# Patient Record
Sex: Female | Born: 1960 | Race: Black or African American | Hispanic: No | Smoking: Never smoker
Health system: Southern US, Community
[De-identification: ages and names within clinical notes are randomized; demographics above are authoritative.]

## PROBLEM LIST (undated history)

## (undated) DIAGNOSIS — Z8709 Personal history of other diseases of the respiratory system: Secondary | ICD-10-CM

## (undated) DIAGNOSIS — M545 Low back pain, unspecified: Secondary | ICD-10-CM

## (undated) DIAGNOSIS — T753XXA Motion sickness, initial encounter: Secondary | ICD-10-CM

## (undated) DIAGNOSIS — M1990 Unspecified osteoarthritis, unspecified site: Secondary | ICD-10-CM

## (undated) DIAGNOSIS — K219 Gastro-esophageal reflux disease without esophagitis: Secondary | ICD-10-CM

## (undated) DIAGNOSIS — R0789 Other chest pain: Secondary | ICD-10-CM

## (undated) DIAGNOSIS — E119 Type 2 diabetes mellitus without complications: Secondary | ICD-10-CM

## (undated) HISTORY — PX: TUBAL LIGATION: SHX77

---

## 1997-12-26 ENCOUNTER — Emergency Department (HOSPITAL_COMMUNITY): Admission: EM | Admit: 1997-12-26 | Discharge: 1997-12-26 | Payer: Self-pay

## 2004-03-23 ENCOUNTER — Ambulatory Visit: Payer: Self-pay | Admitting: Cardiovascular Disease

## 2004-03-23 HISTORY — PX: CARDIAC CATHETERIZATION: SHX172

## 2007-01-24 ENCOUNTER — Ambulatory Visit: Payer: Self-pay

## 2010-09-26 ENCOUNTER — Other Ambulatory Visit: Payer: Self-pay | Admitting: Obstetrics & Gynecology

## 2010-09-26 DIAGNOSIS — R1900 Intra-abdominal and pelvic swelling, mass and lump, unspecified site: Secondary | ICD-10-CM

## 2010-10-03 ENCOUNTER — Ambulatory Visit (HOSPITAL_COMMUNITY)
Admission: RE | Admit: 2010-10-03 | Discharge: 2010-10-03 | Disposition: A | Discharge status: Home / Self Care | Payer: Private Health Insurance - Indemnity | Source: Ambulatory Visit | Attending: Obstetrics & Gynecology | Admitting: Obstetrics & Gynecology

## 2010-10-03 DIAGNOSIS — D259 Leiomyoma of uterus, unspecified: Secondary | ICD-10-CM | POA: Insufficient documentation

## 2010-10-03 DIAGNOSIS — N949 Unspecified condition associated with female genital organs and menstrual cycle: Secondary | ICD-10-CM | POA: Insufficient documentation

## 2010-10-03 DIAGNOSIS — R1900 Intra-abdominal and pelvic swelling, mass and lump, unspecified site: Secondary | ICD-10-CM

## 2010-10-03 MED ORDER — GADOBENATE DIMEGLUMINE 529 MG/ML IV SOLN
20.0000 mL | Freq: Once | INTRAVENOUS | Status: AC | PRN
  Administered 2010-10-03: 20 mL via INTRAVENOUS

## 2010-10-17 ENCOUNTER — Other Ambulatory Visit: Payer: Self-pay | Admitting: Obstetrics & Gynecology

## 2010-11-20 ENCOUNTER — Encounter (HOSPITAL_COMMUNITY)
Admission: RE | Admit: 2010-11-20 | Discharge: 2010-11-20 | Disposition: A | Discharge status: Home / Self Care | Payer: Managed Care, Other (non HMO) | Source: Ambulatory Visit | Attending: Obstetrics & Gynecology | Admitting: Obstetrics & Gynecology

## 2010-11-20 ENCOUNTER — Encounter (HOSPITAL_COMMUNITY): Payer: Self-pay

## 2010-11-20 HISTORY — DX: Gastro-esophageal reflux disease without esophagitis: K21.9

## 2010-11-20 HISTORY — DX: Other chest pain: R07.89

## 2010-11-20 LAB — CBC
HCT: 38.8 % (ref 36.0–46.0)
MCHC: 33.8 g/dL (ref 30.0–36.0)
MCV: 93.3 fL (ref 78.0–100.0)
RDW: 13.2 % (ref 11.5–15.5)

## 2010-11-20 NOTE — Patient Instructions (Signed)
   Your procedure is scheduled on:11/27/10, Monday  Enter through the Main Entrance of [facility] at:0600 Pick up the phone at the desk and dial [phone] and inform us of your arrival.  Please call this number if you have any problems the morning of surgery: [phone]  Remember: Do not eat food after midnight: Sunday Do not drink clear liquids after:0330 am Take these medicines the morning of surgery with a SIP OF WATER:none  Do not wear jewelry, make-up, or FINGER nail polish Do not wear lotions, powders, or perfumes.  You may wear deodorant. Do not shave 48 hours prior to surgery. Do not bring valuables to the hospital.  Leave suitcase in the car. After Surgery it may be brought to your room. For patients being admitted to the hospital, checkout time is 11:00am the day of discharge.  Patients discharged on the day of surgery will not be allowed to drive home.   Name and phone number of your driver: Jake Shark- [phone]  Remember to use your hibiclens as instructed.Please shower with 1/2 bottle the evening before your surgery and the other 1/2 bottle the morning of surgery.

## 2010-11-23 ENCOUNTER — Encounter (HOSPITAL_COMMUNITY): Payer: Self-pay | Admitting: Obstetrics & Gynecology

## 2010-11-23 DIAGNOSIS — D259 Leiomyoma of uterus, unspecified: Secondary | ICD-10-CM | POA: Diagnosis present

## 2010-11-23 NOTE — H&P (Signed)
Hannah [name] is an 50 y.o. female with symptomatic uterine fibroids who presents for [name] hysterectomy.  HPI:  The patient has had pelvic pain and heavy menses.  Workup has included [name] pelvic MRI which showed [name] normal uterus with [name] 10 cm, pedunculated myoma.  The myoma had degenerative changes.  Pert.inent Gynecological History: Menses: flow is moderate                     Contraception: tubal ligation  Previous GYN Procedures: BTL       Past Medical History  Diagnosis Date  . Chest pain, atypical     neg workup- has physical job, lots of lifting, sore muscles  . GERD (gastroesophageal reflux disease)     uses Tums as needed    Past Surgical History  Procedure Date  . Tubal ligation     History reviewed. No pertinent family history.  Social History:  reports that she has never smoked. She does not have any smokeless tobacco history on file. She reports that she does not drink alcohol or use illicit drugs.  Allergies: No Known Allergies  No prescriptions prior to admission    Review of Systems  Constitutional: Negative for fever.  Eyes: Negative for blurred vision.  Respiratory: Negative for shortness of breath.   Gastrointestinal: Negative for nausea and vomiting.  Skin: Negative for rash.  Neurological: Negative for headaches.    There were no vitals taken for this visit. Physical Exam  Constitutional: She appears well-developed.  HENT:  Head: Normocephalic.  Neck: Neck supple. No thyromegaly present.  Cardiovascular: Normal rate and regular rhythm.   Respiratory: Breath sounds normal.  GI: Soft. Bowel sounds are normal.  Genitourinary: Uterus is enlarged.       Mass fills posterior pelvis.  Mobile.  Skin: No rash noted.    No results found for this or any previous visit (from the past 24 hour(s)).  No results found.  Assessment/Plan: 50 y.o. with symptomatic uterine fibroids who presents for an attempted total robotic-assisted hysterectomy. The  risks, benefits and alternative forms of management were reviewed including but not limited to infection, hemorrhage, inadvertent injury to adjacent organs and thromboembolic complications.  Admit Total robotic-assisted hysterectomy--the ovaries are to be conserved  JACKSON-MOORE,Hannah [name] 11/23/2010, 9:51 PM

## 2010-11-26 MED ORDER — CEFAZOLIN SODIUM-DEXTROSE 2-3 GM-% IV SOLR
2.0000 g | INTRAVENOUS | Status: AC
  Administered 2010-11-27: 2 g via INTRAVENOUS
  Filled 2010-11-26: qty 50

## 2010-11-27 ENCOUNTER — Encounter (HOSPITAL_COMMUNITY): Payer: Self-pay | Admitting: Anesthesiology

## 2010-11-27 ENCOUNTER — Other Ambulatory Visit: Payer: Self-pay | Admitting: Obstetrics & Gynecology

## 2010-11-27 ENCOUNTER — Ambulatory Visit (HOSPITAL_COMMUNITY): Payer: Managed Care, Other (non HMO) | Admitting: Anesthesiology

## 2010-11-27 ENCOUNTER — Inpatient Hospital Stay (HOSPITAL_COMMUNITY)
Admission: RE | Admit: 2010-11-27 | Discharge: 2010-11-29 | DRG: 743 | Disposition: A | Discharge status: Home / Self Care | Payer: Managed Care, Other (non HMO) | Source: Ambulatory Visit | Attending: Obstetrics & Gynecology | Admitting: Obstetrics & Gynecology

## 2010-11-27 ENCOUNTER — Encounter (HOSPITAL_COMMUNITY): Payer: Self-pay | Admitting: *Deleted

## 2010-11-27 ENCOUNTER — Encounter (HOSPITAL_COMMUNITY)
Admission: RE | Disposition: A | Discharge status: Home / Self Care | Payer: Self-pay | Source: Ambulatory Visit | Attending: Obstetrics & Gynecology

## 2010-11-27 DIAGNOSIS — D259 Leiomyoma of uterus, unspecified: Secondary | ICD-10-CM

## 2010-11-27 DIAGNOSIS — D251 Intramural leiomyoma of uterus: Secondary | ICD-10-CM | POA: Diagnosis present

## 2010-11-27 DIAGNOSIS — D250 Submucous leiomyoma of uterus: Secondary | ICD-10-CM | POA: Diagnosis present

## 2010-11-27 DIAGNOSIS — N7013 Chronic salpingitis and oophoritis: Secondary | ICD-10-CM | POA: Diagnosis present

## 2010-11-27 DIAGNOSIS — N920 Excessive and frequent menstruation with regular cycle: Principal | ICD-10-CM | POA: Diagnosis present

## 2010-11-27 DIAGNOSIS — N949 Unspecified condition associated with female genital organs and menstrual cycle: Secondary | ICD-10-CM | POA: Diagnosis present

## 2010-11-27 DIAGNOSIS — Z5331 Laparoscopic surgical procedure converted to open procedure: Secondary | ICD-10-CM

## 2010-11-27 HISTORY — PX: LAPAROSCOPY: SHX197

## 2010-11-27 HISTORY — PX: ABDOMINAL HYSTERECTOMY: SHX81

## 2010-11-27 LAB — CBC
Hemoglobin: 12.1 g/dL (ref 12.0–15.0)
MCH: 31.5 pg (ref 26.0–34.0)
RBC: 3.84 MIL/uL — ABNORMAL LOW (ref 3.87–5.11)

## 2010-11-27 LAB — TYPE AND SCREEN: Antibody Screen: NEGATIVE

## 2010-11-27 LAB — ABO/RH: ABO/RH(D): O POS

## 2010-11-27 SURGERY — LAPAROSCOPY, DIAGNOSTIC
Anesthesia: General | Site: Abdomen | Laterality: Right | Wound class: Clean

## 2010-11-27 MED ORDER — HYDROMORPHONE HCL 1 MG/ML IJ SOLN
INTRAMUSCULAR | Status: DC | PRN
  Administered 2010-11-27: 1 mg via INTRAVENOUS

## 2010-11-27 MED ORDER — KETOROLAC TROMETHAMINE 30 MG/ML IJ SOLN
30.0000 mg | Freq: Four times a day (QID) | INTRAMUSCULAR | Status: DC
  Administered 2010-11-27 – 2010-11-29 (×4): 30 mg via INTRAVENOUS
  Filled 2010-11-27 (×4): qty 1

## 2010-11-27 MED ORDER — MIDAZOLAM HCL 2 MG/2ML IJ SOLN
INTRAMUSCULAR | Status: AC
  Filled 2010-11-27: qty 2

## 2010-11-27 MED ORDER — LIDOCAINE HCL (CARDIAC) 20 MG/ML IV SOLN
INTRAVENOUS | Status: AC
  Filled 2010-11-27: qty 5

## 2010-11-27 MED ORDER — MAGNESIUM HYDROXIDE 400 MG/5ML PO SUSP: 30.0000 mL | Freq: Every day | ORAL | Status: DC | PRN

## 2010-11-27 MED ORDER — GLYCOPYRROLATE 0.2 MG/ML IJ SOLN
INTRAMUSCULAR | Status: AC
  Filled 2010-11-27: qty 2

## 2010-11-27 MED ORDER — FENTANYL CITRATE 0.05 MG/ML IJ SOLN
INTRAMUSCULAR | Status: AC
  Filled 2010-11-27: qty 5

## 2010-11-27 MED ORDER — PHENYLEPHRINE 40 MCG/ML (10ML) SYRINGE FOR IV PUSH (FOR BLOOD PRESSURE SUPPORT)
PREFILLED_SYRINGE | INTRAVENOUS | Status: AC
  Filled 2010-11-27: qty 10

## 2010-11-27 MED ORDER — KETOROLAC TROMETHAMINE 30 MG/ML IJ SOLN
INTRAMUSCULAR | Status: DC | PRN
  Administered 2010-11-27: 30 mg via INTRAVENOUS

## 2010-11-27 MED ORDER — ONDANSETRON HCL 4 MG/2ML IJ SOLN
INTRAMUSCULAR | Status: DC | PRN
  Administered 2010-11-27 (×2): 2 mg via INTRAVENOUS

## 2010-11-27 MED ORDER — KCL IN DEXTROSE-NACL 20-5-0.45 MEQ/L-%-% IV SOLN
INTRAVENOUS | Status: DC
  Administered 2010-11-27 – 2010-11-28 (×3): via INTRAVENOUS
  Filled 2010-11-27 (×6): qty 1000

## 2010-11-27 MED ORDER — KETOROLAC TROMETHAMINE 30 MG/ML IJ SOLN
INTRAMUSCULAR | Status: AC
  Filled 2010-11-27: qty 1

## 2010-11-27 MED ORDER — KETOROLAC TROMETHAMINE 30 MG/ML IJ SOLN: 30.0000 mg | Freq: Four times a day (QID) | INTRAMUSCULAR | Status: DC

## 2010-11-27 MED ORDER — NEOSTIGMINE METHYLSULFATE 1 MG/ML IJ SOLN
INTRAMUSCULAR | Status: DC | PRN
  Administered 2010-11-27: 5 mg via INTRAVENOUS

## 2010-11-27 MED ORDER — HYDROMORPHONE 0.3 MG/ML IV SOLN
INTRAVENOUS | Status: DC
  Administered 2010-11-27: 1.2 mg via INTRAVENOUS
  Administered 2010-11-27: 12:00:00 via INTRAVENOUS
  Administered 2010-11-27: 2.1 mg via INTRAVENOUS
  Administered 2010-11-27 – 2010-11-28 (×2): 0.6 mg via INTRAVENOUS
  Administered 2010-11-28: 0.9 mg via INTRAVENOUS

## 2010-11-27 MED ORDER — LIDOCAINE HCL (CARDIAC) 20 MG/ML IV SOLN
INTRAVENOUS | Status: DC | PRN
  Administered 2010-11-27: 60 mg via INTRAVENOUS

## 2010-11-27 MED ORDER — ONDANSETRON HCL 4 MG/2ML IJ SOLN
INTRAMUSCULAR | Status: AC
  Filled 2010-11-27: qty 2

## 2010-11-27 MED ORDER — BUPIVACAINE LIPOSOME 1.3 % IJ SUSP
20.0000 mL | Freq: Once | INTRAMUSCULAR | Status: DC
  Filled 2010-11-27: qty 20

## 2010-11-27 MED ORDER — ONDANSETRON HCL 4 MG/2ML IJ SOLN: 4.0000 mg | Freq: Four times a day (QID) | INTRAMUSCULAR | Status: DC | PRN

## 2010-11-27 MED ORDER — DIPHENHYDRAMINE HCL 50 MG/ML IJ SOLN: 12.5000 mg | Freq: Four times a day (QID) | INTRAMUSCULAR | Status: DC | PRN

## 2010-11-27 MED ORDER — BUPIVACAINE HCL 0.25 % IJ SOLN
INTRAMUSCULAR | Status: DC | PRN
  Administered 2010-11-27: 3 mL

## 2010-11-27 MED ORDER — PHENYLEPHRINE HCL 10 MG/ML IJ SOLN
INTRAMUSCULAR | Status: DC | PRN
  Administered 2010-11-27 (×3): .08 mg via INTRAVENOUS

## 2010-11-27 MED ORDER — PROPOFOL 10 MG/ML IV EMUL
INTRAVENOUS | Status: AC
  Filled 2010-11-27: qty 20

## 2010-11-27 MED ORDER — NEOSTIGMINE METHYLSULFATE 1 MG/ML IJ SOLN
INTRAMUSCULAR | Status: AC
  Filled 2010-11-27: qty 10

## 2010-11-27 MED ORDER — DEXAMETHASONE SODIUM PHOSPHATE 4 MG/ML IJ SOLN
INTRAMUSCULAR | Status: DC | PRN
  Administered 2010-11-27 (×2): 5 mg via INTRAVENOUS

## 2010-11-27 MED ORDER — DIPHENHYDRAMINE HCL 12.5 MG/5ML PO ELIX: 12.5000 mg | ORAL_SOLUTION | Freq: Four times a day (QID) | ORAL | Status: DC | PRN

## 2010-11-27 MED ORDER — FENTANYL CITRATE 0.05 MG/ML IJ SOLN
INTRAMUSCULAR | Status: DC | PRN
  Administered 2010-11-27 (×2): 100 ug via INTRAVENOUS
  Administered 2010-11-27: 50 ug via INTRAVENOUS
  Administered 2010-11-27: 100 ug via INTRAVENOUS
  Administered 2010-11-27 (×2): 50 ug via INTRAVENOUS

## 2010-11-27 MED ORDER — IBUPROFEN 600 MG PO TABS
600.0000 mg | ORAL_TABLET | Freq: Four times a day (QID) | ORAL | Status: DC | PRN
  Administered 2010-11-29: 600 mg via ORAL
  Filled 2010-11-27: qty 1

## 2010-11-27 MED ORDER — BUPIVACAINE LIPOSOME 1.3 % IJ SUSP
INTRAMUSCULAR | Status: DC | PRN
  Administered 2010-11-27: 30 mL

## 2010-11-27 MED ORDER — MIDAZOLAM HCL 5 MG/5ML IJ SOLN
INTRAMUSCULAR | Status: DC | PRN
  Administered 2010-11-27: 1.5 mg via INTRAVENOUS
  Administered 2010-11-27: .5 mg via INTRAVENOUS

## 2010-11-27 MED ORDER — HYDROMORPHONE 0.3 MG/ML IV SOLN
INTRAVENOUS | Status: AC
  Filled 2010-11-27: qty 25

## 2010-11-27 MED ORDER — ONDANSETRON HCL 4 MG PO TABS: 4.0000 mg | ORAL_TABLET | Freq: Four times a day (QID) | ORAL | Status: DC | PRN

## 2010-11-27 MED ORDER — EPHEDRINE SULFATE 50 MG/ML IJ SOLN: INTRAMUSCULAR | Status: DC | PRN

## 2010-11-27 MED ORDER — DEXAMETHASONE SODIUM PHOSPHATE 10 MG/ML IJ SOLN
INTRAMUSCULAR | Status: AC
  Filled 2010-11-27: qty 1

## 2010-11-27 MED ORDER — HYDROMORPHONE HCL 1 MG/ML IJ SOLN
INTRAMUSCULAR | Status: AC
  Filled 2010-11-27: qty 1

## 2010-11-27 MED ORDER — PROPOFOL 10 MG/ML IV EMUL
INTRAVENOUS | Status: DC | PRN
  Administered 2010-11-27: 200 mg via INTRAVENOUS

## 2010-11-27 MED ORDER — MORPHINE SULFATE 10 MG/ML IJ SOLN: 0.0500 mg/kg | INTRAMUSCULAR | Status: DC | PRN

## 2010-11-27 MED ORDER — ROCURONIUM BROMIDE 50 MG/5ML IV SOLN
INTRAVENOUS | Status: AC
  Filled 2010-11-27: qty 2

## 2010-11-27 MED ORDER — OXYCODONE-ACETAMINOPHEN 5-325 MG PO TABS
1.0000 | ORAL_TABLET | ORAL | Status: DC | PRN
  Administered 2010-11-28 – 2010-11-29 (×5): 2 via ORAL
  Filled 2010-11-27 (×3): qty 2
  Filled 2010-11-27: qty 1
  Filled 2010-11-27 (×2): qty 2

## 2010-11-27 MED ORDER — GLYCOPYRROLATE 0.2 MG/ML IJ SOLN
INTRAMUSCULAR | Status: DC | PRN
  Administered 2010-11-27: .8 mg via INTRAVENOUS

## 2010-11-27 MED ORDER — ZOLPIDEM TARTRATE 5 MG PO TABS: 5.0000 mg | ORAL_TABLET | Freq: Every evening | ORAL | Status: DC | PRN

## 2010-11-27 MED ORDER — ROCURONIUM BROMIDE 100 MG/10ML IV SOLN
INTRAVENOUS | Status: DC | PRN
  Administered 2010-11-27: 50 mg via INTRAVENOUS

## 2010-11-27 MED ORDER — LACTATED RINGERS IV SOLN
INTRAVENOUS | Status: DC
  Administered 2010-11-27: 07:00:00 via INTRAVENOUS

## 2010-11-27 MED ORDER — SODIUM CHLORIDE 0.9 % IJ SOLN: 9.0000 mL | INTRAMUSCULAR | Status: DC | PRN

## 2010-11-27 MED ORDER — NALOXONE HCL 0.4 MG/ML IJ SOLN: 0.4000 mg | INTRAMUSCULAR | Status: DC | PRN

## 2010-11-27 MED ORDER — KETOROLAC TROMETHAMINE 30 MG/ML IJ SOLN: 30.0000 mg | Freq: Once | INTRAMUSCULAR | Status: DC

## 2010-11-27 MED ORDER — SODIUM CHLORIDE 0.9 % IR SOLN
Status: DC | PRN
  Administered 2010-11-27: 1000 mL

## 2010-11-27 SURGICAL SUPPLY — 96 items
BAG URINE DRAINAGE (UROLOGICAL SUPPLIES) ×4 IMPLANT
BARRIER ADHS 3X4 INTERCEED (GAUZE/BANDAGES/DRESSINGS) IMPLANT
BENZOIN TINCTURE PRP APPL 2/3 (GAUZE/BANDAGES/DRESSINGS) IMPLANT
BLADELESS LONG 8MM (BLADE) IMPLANT
CABLE HIGH FREQUENCY MONO STRZ (ELECTRODE) ×4 IMPLANT
CANISTER SUCTION 2500CC (MISCELLANEOUS) ×4 IMPLANT
CATH FOLEY 3WAY  5CC 16FR (CATHETERS) ×1
CATH FOLEY 3WAY 5CC 16FR (CATHETERS) ×3 IMPLANT
CELLS DAT CNTRL 66122 CELL SVR (MISCELLANEOUS) IMPLANT
CHLORAPREP W/TINT 26ML (MISCELLANEOUS) ×4 IMPLANT
CLOTH BEACON ORANGE TIMEOUT ST (SAFETY) ×4 IMPLANT
CONT PATH 16OZ SNAP LID 3702 (MISCELLANEOUS) ×4 IMPLANT
CORDS BIPOLAR (ELECTRODE) IMPLANT
COVER MAYO STAND STRL (DRAPES) ×4 IMPLANT
COVER TABLE BACK 60X90 (DRAPES) ×8 IMPLANT
COVER TIP SHEARS 8 DVNC (MISCELLANEOUS) ×3 IMPLANT
COVER TIP SHEARS 8MM DA VINCI (MISCELLANEOUS) ×1
DECANTER SPIKE VIAL GLASS SM (MISCELLANEOUS) ×4 IMPLANT
DERMABOND ADVANCED (GAUZE/BANDAGES/DRESSINGS) ×1
DERMABOND ADVANCED .7 DNX12 (GAUZE/BANDAGES/DRESSINGS) ×3 IMPLANT
DILATOR CANAL MILEX (MISCELLANEOUS) ×4 IMPLANT
DRAPE CAMERA CLOSED 9X96 (DRAPES) IMPLANT
DRAPE HUG U DISPOSABLE (DRAPE) ×4 IMPLANT
DRAPE LG THREE QUARTER DISP (DRAPES) ×8 IMPLANT
DRAPE MONITOR DA VINCI (DRAPE) ×4 IMPLANT
DRAPE UNDERBUTTOCKS STRL (DRAPE) ×4 IMPLANT
DRAPE UTILITY XL STRL (DRAPES) IMPLANT
DRAPE WARM FLUID 44X44 (DRAPE) ×4 IMPLANT
DRSG COVADERM 4X8 (GAUZE/BANDAGES/DRESSINGS) ×4 IMPLANT
ELECT BLADE 6 FLAT ULTRCLN (ELECTRODE) ×4 IMPLANT
ELECT REM PT RETURN 9FT ADLT (ELECTROSURGICAL) ×4
ELECTRODE REM PT RTRN 9FT ADLT (ELECTROSURGICAL) ×3 IMPLANT
EVACUATOR SMOKE 8.L (FILTER) ×4 IMPLANT
GAUZE SPONGE 4X4 16PLY XRAY LF (GAUZE/BANDAGES/DRESSINGS) ×4 IMPLANT
GAUZE VASELINE 3X9 (GAUZE/BANDAGES/DRESSINGS) IMPLANT
GLOVE BIO SURGEON STRL SZ 6.5 (GLOVE) ×20 IMPLANT
GLOVE BIO SURGEON STRL SZ8 (GLOVE) IMPLANT
GLOVE BIOGEL PI IND STRL 7.0 (GLOVE) ×12 IMPLANT
GLOVE BIOGEL PI INDICATOR 7.0 (GLOVE) ×4
GLOVE ECLIPSE 6.5 STRL STRAW (GLOVE) ×16 IMPLANT
GOWN PREVENTION PLUS LG XLONG (DISPOSABLE) ×40 IMPLANT
KIT DISP ACCESSORY 4 ARM (KITS) ×4 IMPLANT
MANIPULATOR UTERINE 4.5 ZUMI (MISCELLANEOUS) ×4 IMPLANT
NEEDLE HYPO 25X1 1.5 SAFETY (NEEDLE) IMPLANT
NEEDLE INSUFFLATION 14GA 120MM (NEEDLE) ×4 IMPLANT
NS IRRIG 1000ML POUR BTL (IV SOLUTION) ×4 IMPLANT
OCCLUDER COLPOPNEUMO (BALLOONS) ×4 IMPLANT
PACK ABDOMINAL GYN (CUSTOM PROCEDURE TRAY) IMPLANT
PACK LAVH (CUSTOM PROCEDURE TRAY) ×4 IMPLANT
PAD MAGNETIC INST (MISCELLANEOUS) IMPLANT
PAD OB MATERNITY 4.3X12.25 (PERSONAL CARE ITEMS) ×4 IMPLANT
PAD PREP 24X48 CUFFED NSTRL (MISCELLANEOUS) ×8 IMPLANT
PLUG CATH AND CAP STER (CATHETERS) ×4 IMPLANT
RETRACTOR WND ALEXIS 25 LRG (MISCELLANEOUS) IMPLANT
RTRCTR WOUND ALEXIS 18CM MED (MISCELLANEOUS)
RTRCTR WOUND ALEXIS 25CM LRG (MISCELLANEOUS)
SEPRAFILM MEMBRANE 5X6 (MISCELLANEOUS) IMPLANT
SET IRRIG TUBING LAPAROSCOPIC (IRRIGATION / IRRIGATOR) ×4 IMPLANT
SHEET LAVH (DRAPES) IMPLANT
SOLUTION ELECTROLUBE (MISCELLANEOUS) ×4 IMPLANT
SPONGE LAP 18X18 X RAY DECT (DISPOSABLE) ×12 IMPLANT
STAPLER VISISTAT 35W (STAPLE) IMPLANT
STRIP CLOSURE SKIN 1/2X4 (GAUZE/BANDAGES/DRESSINGS) ×4 IMPLANT
STRIP CLOSURE SKIN 1/4X4 (GAUZE/BANDAGES/DRESSINGS) IMPLANT
SUT MON AB 2-0 CT1 27 (SUTURE) ×4 IMPLANT
SUT MON AB 3-0 SH 27 (SUTURE) ×1
SUT MON AB 3-0 SH27 (SUTURE) ×3 IMPLANT
SUT MONOCRYL 1 CT 1 27 (SUTURE) IMPLANT
SUT PDS AB 1 CTX 36 (SUTURE) ×8 IMPLANT
SUT VIC AB 0 CT1 18XCR BRD8 (SUTURE) ×3 IMPLANT
SUT VIC AB 0 CT1 27 (SUTURE) ×3
SUT VIC AB 0 CT1 27XCR 8 STRN (SUTURE) ×9 IMPLANT
SUT VIC AB 0 CT1 8-18 (SUTURE) ×1
SUT VIC AB 2-0 SH 27 (SUTURE) ×1
SUT VIC AB 2-0 SH 27XBRD (SUTURE) ×3 IMPLANT
SUT VICRYL 0 TIES 12 18 (SUTURE) ×4 IMPLANT
SYR 50ML LL SCALE MARK (SYRINGE) ×4 IMPLANT
SYR CONTROL 10ML LL (SYRINGE) IMPLANT
SYSTEM CONVERTIBLE TROCAR (TROCAR) IMPLANT
TIP UTERINE 5.1X6CM LAV DISP (MISCELLANEOUS) IMPLANT
TIP UTERINE 6.7X10CM GRN DISP (MISCELLANEOUS) IMPLANT
TIP UTERINE 6.7X6CM WHT DISP (MISCELLANEOUS) IMPLANT
TIP UTERINE 6.7X8CM BLUE DISP (MISCELLANEOUS) IMPLANT
TOWEL OR 17X24 6PK STRL BLUE (TOWEL DISPOSABLE) ×20 IMPLANT
TRAY FOLEY CATH 14FR (SET/KITS/TRAYS/PACK) IMPLANT
TROCAR 12M 150ML BLUNT (TROCAR) IMPLANT
TROCAR DISP BLADELESS 8 DVNC (TROCAR) ×3 IMPLANT
TROCAR DISP BLADELESS 8MM (TROCAR) ×1
TROCAR Z-THREAD 12X150 (TROCAR) ×4 IMPLANT
TROCAR Z-THREAD BLADED 12X100M (TROCAR) IMPLANT
TROCAR Z-THREAD OPTICAL 5X100M (TROCAR) IMPLANT
TUBING CONNECTOR 18X5MM (MISCELLANEOUS) ×4 IMPLANT
TUBING FILTER THERMOFLATOR (ELECTROSURGICAL) ×4 IMPLANT
WARMER LAPAROSCOPE (MISCELLANEOUS) ×4 IMPLANT
WATER STERILE IRR 1000ML POUR (IV SOLUTION) ×12 IMPLANT
YANKAUER SUCT BULB TIP NO VENT (SUCTIONS) ×4 IMPLANT

## 2010-11-27 NOTE — Anesthesia Postprocedure Evaluation (Signed)
  Anesthesia Post-op Note  Patient: Hannah [name]  Procedure(s) Performed:  LAPAROSCOPY DIAGNOSTIC; HYSTERECTOMY ABDOMINAL; UNILATERAL SALPINGECTOMY  Patient Location: [facility]  Anesthesia Type: General  Level of Consciousness: awake, alert  and oriented  Airway and Oxygen Therapy:spontaneous breaths   Post-op Pain: none  Post-op Assessment: Post-op Vital signs reviewed and Patient's Cardiovascular Status Stable  Post-op Vital Signs: Reviewed and stable  Complications: No apparent anesthesia complications

## 2010-11-27 NOTE — H&P (Signed)
  History and Physical Interval Note:   11/27/2010   7:26 AM   Hannah [name]  has presented today for surgery, with the diagnosis of uterine fibroids  The various methods of treatment have been discussed with the patient and family. After consideration of risks, benefits and other options for treatment, the patient has consented to  Procedure(s): ROBOTIC ASSISTED TOTAL HYSTERECTOMY as a surgical intervention .  I have reviewed the patients' chart and labs.  Questions were answered to the patient's satisfaction.     [name]  MD

## 2010-11-27 NOTE — Anesthesia Postprocedure Evaluation (Signed)
Anesthesia Post Note  Patient: Hannah [name]  Procedure(s) Performed:  LAPAROSCOPY DIAGNOSTIC; HYSTERECTOMY ABDOMINAL; UNILATERAL SALPINGECTOMY  Anesthesia type: General  Patient location: PACU  Post pain: Pain level controlled  Post assessment: Post-op Vital signs reviewed  Last Vitals:  Filed Vitals:   11/27/10 1031  BP: 122/75  Pulse: 69  Temp:   Resp: 14    Post vital signs: Reviewed  Level of consciousness: sedated  Complications: No apparent anesthesia complicationsfj

## 2010-11-27 NOTE — Op Note (Signed)
Hysterectomy Procedure Note  Indications: Symptomatic uterine fibroids  Pre-operative Diagnosis: Same  Post-operative Diagnosis: Same  Operation: Diagnostic laparoscopy/ exploratory laparotomy/ total abdominal hysterectomy/right salpingectomy  Surgeon: Antionette Char A   Assistants: Leda Quail  Anesthesia: General endotracheal anesthesia  ASA Class: 1  Procedure Details  The patient was seen in the Holding Room. The risks, benefits, complications, treatment options, and expected outcomes were discussed with the patient.  The patient concurred with the proposed plan, giving informed consent.   The patient was  identified as Hannah [name] and the procedure verified as a robotic-assisted hysterectomy. A Time Out was held and the above information confirmed.  After induction of anesthesia, the patient was draped and prepped in the usual sterile manner. The patient was placed in the semi-lithotomy position, in Candor stirrups after anesthesia and draped and prepped in the usual sterile manner. A foley catheter was placed.  The skin in the left upper quadrant was infiltrated with 0.25% marcaine.  An incision was made to accommodate a 5 mm Optiview which was introduced under direct visualization.  Upon inspection of the diffuse fibroid involvement of the uterus, the decision was made to proceed with an abdominal approach.  A transverse incision was made with the scalpel.   The incision was carried through the subcutaneous tissue to the fascia. The fascial incision was made and extended with curved Mayo scissors. The rectus muscles were separated. The peritoneum was identified and entered. Peritoneal incision was extended longitudinally.  The above findings were noted. An Lenox Ahr  retractor was placed and bowel was packed away with moistened laparotomy packs.   The round ligaments were identified and cut with the bovie. The anterior peritoneal reflection was incised and the  bladder was dissected off the lower uterine segment. The right fallopian tube/utero-ovarian ligament was grasped and cut.  A free ligature and  suture ligature with 2-0-Vicryl were placed.  The left side was manipulated in a similar fashion.  Hemostasis  was observed. The uterine vessels were skeletonized, then clamped, cut and suture ligated with 0-Vicryl suture. At this point the uterine fundus was amputated at the level of the uterine vessel pedicles.  Serial pedicles of the cardinal and utero-sacral ligaments were clamped, cut, and suture ligated with 0-Vicryl. Entrance was made into the vagina and the cervix amputated. The vaginal cuff angle were suture ligated with 0-Vicryl suture. placed incorporating the utero-sacral ligaments for support. The vaginal cuff was then closed with interrupted figure-of-eight sutures of 0- Vicryl. The right mesosalpinx was clamped and the right tube was excised.  The pedicle was secured with a 2-0 vicryl suture ligature.  The pelvis was copiously irrigated. Hemostasis was observed.  The retractor and all packing were removed from the abdomen. The fascia was approximated with running sutures of 0-Vicryl. The subcutaneous layer was irrigated. Hemostasis was observed. Exparel was injected in the subcutaneous layer.  The skin was approximated in a subcuticular fashion with 3-0 Monocryl.  Instrument, sponge, and needle counts were correct prior to abdominal closure and at the conclusion of the case.   Findings: Diffuse fibroid involvement.  Right hydrosalpinx  Estimated Blood Loss:  250                 Total IV Fluids: per Anesthesiology         Specimens: Uterus/cervix/right Fallopian tube                  Complications:  None; patient tolerated the procedure well.  Disposition: PACU - hemodynamically stable.         Condition: stable

## 2010-11-27 NOTE — Preoperative (Signed)
Beta Blockers   Reason not to administer Beta Blockers:Not Applicable 

## 2010-11-27 NOTE — Addendum Note (Signed)
Addendum  created 11/27/10 1824 by Madison Hickman   Modules edited:Notes Section

## 2010-11-27 NOTE — Transfer of Care (Signed)
Immediate Anesthesia Transfer of Care Note  Patient: Hannah [name]  Procedure(s) Performed:  LAPAROSCOPY DIAGNOSTIC; HYSTERECTOMY ABDOMINAL; UNILATERAL SALPINGECTOMY  Patient Location: PACU  Anesthesia Type: General  Level of Consciousness: awake, alert  and oriented  Airway & Oxygen Therapy: Patient Spontanous Breathing and Patient connected to nasal cannula oxygen  Post-op Assessment: Report given to PACU RN, Post -op Vital signs reviewed and unstable, Anesthesiologist notified, Patient moving all extremities and Patient moving all extremities X 4  Post vital signs: Reviewed and stable  Complications: No apparent anesthesia complications

## 2010-11-27 NOTE — Addendum Note (Signed)
Addendum  created 11/27/10 1211 by Naw Lasala Adedayo Srinivas Lippman, CRNA   Modules edited:Anesthesia Events    

## 2010-11-27 NOTE — Addendum Note (Signed)
Addendum  created 11/27/10 1211 by Blythe Stanford, CRNA   Modules edited:Anesthesia Events

## 2010-11-27 NOTE — Anesthesia Preprocedure Evaluation (Signed)
Anesthesia Evaluation  Patient identified by MRN, date of birth, ID band Patient awake  General Assessment Comment  Reviewed: Allergy & Precautions, H&P , Patient's Chart, lab work & pertinent test results, reviewed documented beta blocker date and time   Airway Mallampati: II TM Distance: >3 FB Neck ROM: full    Dental No notable dental hx.    Pulmonary  clear to auscultation  Pulmonary exam normal       Cardiovascular regular Normal    Neuro/Psych    GI/Hepatic   Endo/Other    Renal/GU      Musculoskeletal   Abdominal   Peds  Hematology   Anesthesia Other Findings   Reproductive/Obstetrics                           Anesthesia Physical Anesthesia Plan  ASA: II  Anesthesia Plan: General   Post-op Pain Management:    Induction: Intravenous  Airway Management Planned: Oral ETT  Additional Equipment:   Intra-op Plan:   Post-operative Plan:   Informed Consent: I have reviewed the patients History and Physical, chart, labs and discussed the procedure including the risks, benefits and alternatives for the proposed anesthesia with the patient or authorized representative who has indicated his/her understanding and acceptance.   Dental Advisory Given  Plan Discussed with: CRNA and Surgeon  Anesthesia Plan Comments: (  Discussed  general anesthesia, including possible nausea, instrumentation of airway, sore throat,pulmonary aspiration, etc. I asked if the were any outstanding questions, or  concerns before we proceeded. )        Anesthesia Quick Evaluation

## 2010-11-28 ENCOUNTER — Encounter (HOSPITAL_COMMUNITY): Payer: Self-pay | Admitting: Obstetrics & Gynecology

## 2010-11-28 NOTE — Progress Notes (Signed)
  1 Day Post-Op Procedure(s) (LRB): LAPAROSCOPY DIAGNOSTIC (N/A) HYSTERECTOMY ABDOMINAL (N/A) UNILATERAL SALPINGECTOMY (Right)  Subjective: Patient reports no complaints  Objective: Vital signs in last 24 hours: Temp:  [98.1 F (36.7 C)-98.7 F (37.1 C)] 98.2 F (36.8 C) (10/23 0937) Pulse Rate:  [63-68] 68  (10/23 0937) Resp:  [16-20] 16  (10/23 0937) BP: (102-121)/(65-74) 111/72 mmHg (10/23 0937) SpO2:  [98 %-100 %] 100 % (10/23 0937)    Intake/Output from previous day: 10/22 0701 - 10/23 0700 In: 2830 [P.O.:330; I.V.:2500] Out: 3225 [Urine:2900; Emesis/NG output:75; Blood:250] Intake/Output this shift: Total I/O In: 120 [P.O.:120] Out: 500 [Urine:500]  Physical Examination:  General: alert GI: soft, non-tender; bowel sounds normal; no masses,  no organomegaly Extremities: extremities normal, atraumatic, no cyanosis or edema I: C/D/I  Labs:       Assessment:  50 y.o. s/p Procedure(s): LAPAROSCOPY DIAGNOSTIC HYSTERECTOMY ABDOMINAL UNILATERAL SALPINGECTOMY: stable  Pain: Pain is well-controlled on PCA.   GI:  Tolerating po: Yes      Prophylaxis: intermittent pneumatic compression boots.  Plan: Advance diet Ambulate  D/C PCA  LOS: 1 day     JACKSON-MOORE,Voyd Groft A 11/28/2010, 1:53 PM

## 2010-11-28 NOTE — Progress Notes (Signed)
UR Chart review completed.  

## 2010-11-29 LAB — CBC
Hemoglobin: 10.6 g/dL — ABNORMAL LOW (ref 12.0–15.0)
MCH: 31.8 pg (ref 26.0–34.0)
MCHC: 33.9 g/dL (ref 30.0–36.0)
Platelets: 176 10*3/uL (ref 150–400)
RBC: 3.33 MIL/uL — ABNORMAL LOW (ref 3.87–5.11)

## 2010-11-29 MED ORDER — OXYCODONE-ACETAMINOPHEN 5-325 MG PO TABS: 1.0000 | ORAL_TABLET | ORAL | Status: AC | PRN

## 2010-11-29 NOTE — Discharge Summary (Signed)
  Physician Discharge Summary  Patient ID: Hannah [name] MRN: [id number] DOB/AGE: November 22, 1960 50 y.o.  Admit date: 11/27/2010 Discharge date: 11/29/2010  Admission Diagnoses: Active Problems:  Leiomyoma of uterus, unspecified   Discharge Diagnoses:  S/P TAH for leiomyoma of uterus  Discharged Condition: good  Hospital Course: The patient was admitted and underwent a diagnostic laparoscopy/TAH.  Please see the dictated operative summary for findings.  Her postoperative course was uneventful.  She was tolerating a regular diet at discharge.   Discharge Exam: Blood pressure 132/74, pulse 62, temperature 98.7 F (37.1 C), temperature source Oral, resp. rate 18, height 5' 7.5" (1.715 m), weight 82.555 kg (182 lb), last menstrual period 08/27/2010, SpO2 99.00%. General appearance: alert GI: soft, non-tender; bowel sounds normal; no masses,  no organomegaly  Disposition: Final discharge disposition not confirmed  Discharge Orders    Future Orders Please Complete By Expires   Diet - low sodium heart healthy      Increase activity slowly      Driving Restrictions      Comments:   No driving x 2 weeks   Lifting restrictions      Comments:   Nothing > 30 lb x 3 months   Sexual Activity Restrictions      Comments:   No intercourse for 6 weeks   Discharge wound care:      Comments:   Keep clean/dry   Call MD for:  temperature >100.4      Call MD for:  persistant nausea and vomiting      Call MD for:  severe uncontrolled pain      Call MD for:  redness, tenderness, or signs of infection (pain, swelling, redness, odor or green/yellow discharge around incision site)        Current Discharge Medication List    START taking these medications   Details  oxyCODONE-acetaminophen (PERCOCET) 5-325 MG per tablet Take 1-2 tablets by mouth every 3 (three) hours as needed (moderate to severe pain (when tolerating fluids)). Qty: 30 tablet, Refills: 0      CONTINUE these medications  which have NOT CHANGED   Details  naproxen sodium (ANAPROX) 220 MG tablet Take 220 mg by mouth as needed.         Follow-up Information    Follow up with Antionette Char A, MD. Call in 2 days.   Contact information:   [address], [address] [place] [address] [phone]          Signed: [name] 11/29/2010, 12:12 PM

## 2010-11-29 NOTE — Progress Notes (Signed)
2 Days Post-Op Procedure(s) (LRB): LAPAROSCOPY DIAGNOSTIC (N/A) HYSTERECTOMY ABDOMINAL (N/A) UNILATERAL SALPINGECTOMY (Right)  Subjective: Patient reports + flatus and no problems voiding.    Objective: Vital signs in last 24 hours: Temp:  [98 F (36.7 C)-98.7 F (37.1 C)] 98.7 F (37.1 C) (10/24 1200) Pulse Rate:  [62-87] 62  (10/24 1200) Resp:  [16-20] 18  (10/24 1200) BP: (108-132)/(58-78) 132/74 mmHg (10/24 1200) SpO2:  [98 %-100 %] 99 % (10/24 1200) Last BM Date: 11/26/10  Intake/Output from previous day: 10/23 0701 - 10/24 0700 In: 3566.7 [P.O.:600; I.V.:2966.7] Out: 1850 [Urine:1850] Intake/Output this shift:    Physical Examination:  General: alert GI: soft, non-tender; bowel sounds normal; no masses,  no organomegaly.  Incision intact.  Dried heme.   Labs:       Assessment:  50 y.o. s/p Procedure(s): LAPAROSCOPY DIAGNOSTIC HYSTERECTOMY ABDOMINAL UNILATERAL SALPINGECTOMY: progressing well and tolerating diet  Pain: Pain is well-controlled on or oral medications.  GI:  Tolerating po: Yes     Plan: Discharge home   LOS: 2 days     JACKSON-MOORE,Delesha Pohlman A 11/29/2010, 12:05 PM

## 2011-08-12 ENCOUNTER — Emergency Department: Payer: Self-pay | Admitting: Emergency Medicine

## 2012-07-15 ENCOUNTER — Ambulatory Visit: Payer: Self-pay | Admitting: Family Medicine

## 2012-08-05 ENCOUNTER — Ambulatory Visit: Payer: Self-pay | Admitting: Family Medicine

## 2013-04-22 ENCOUNTER — Ambulatory Visit: Payer: Self-pay | Admitting: Family Medicine

## 2013-12-15 ENCOUNTER — Ambulatory Visit: Payer: Self-pay | Admitting: Family Medicine

## 2014-08-04 ENCOUNTER — Encounter: Payer: Self-pay | Admitting: *Deleted

## 2014-08-05 NOTE — Anesthesia Preprocedure Evaluation (Addendum)
Anesthesia Evaluation  Patient identified by MRN, date of birth, ID band  Reviewed: Allergy & Precautions, H&P , NPO status , Patient's Chart, lab work & pertinent test results  Airway Mallampati: II  TM Distance: >3 FB Neck ROM: full    Dental no notable dental hx.    Pulmonary    Pulmonary exam normal       Cardiovascular Rhythm:regular Rate:Normal     Neuro/Psych    GI/Hepatic GERD-  ,  Endo/Other    Renal/GU      Musculoskeletal   Abdominal   Peds  Hematology   Anesthesia Other Findings   Reproductive/Obstetrics                             Anesthesia Physical Anesthesia Plan  ASA: II  Anesthesia Plan: General LMA   Post-op Pain Management:    Induction:   Airway Management Planned:   Additional Equipment:   Intra-op Plan:   Post-operative Plan:   Informed Consent: I have reviewed the patients History and Physical, chart, labs and discussed the procedure including the risks, benefits and alternatives for the proposed anesthesia with the patient or authorized representative who has indicated his/her understanding and acceptance.     Plan Discussed with: CRNA  Anesthesia Plan Comments:         Anesthesia Quick Evaluation

## 2014-08-06 ENCOUNTER — Ambulatory Visit: Payer: No Typology Code available for payment source | Admitting: Anesthesiology

## 2014-08-06 ENCOUNTER — Encounter
Admission: RE | Disposition: A | Discharge status: Home / Self Care | Payer: Self-pay | Source: Ambulatory Visit | Attending: Unknown Physician Specialty

## 2014-08-06 ENCOUNTER — Ambulatory Visit
Admission: RE | Admit: 2014-08-06 | Discharge: 2014-08-06 | Disposition: A | Discharge status: Home / Self Care | Payer: No Typology Code available for payment source | Source: Ambulatory Visit | Attending: Unknown Physician Specialty | Admitting: Unknown Physician Specialty

## 2014-08-06 DIAGNOSIS — Z803 Family history of malignant neoplasm of breast: Secondary | ICD-10-CM | POA: Diagnosis not present

## 2014-08-06 DIAGNOSIS — Z90710 Acquired absence of both cervix and uterus: Secondary | ICD-10-CM | POA: Insufficient documentation

## 2014-08-06 DIAGNOSIS — Z818 Family history of other mental and behavioral disorders: Secondary | ICD-10-CM | POA: Insufficient documentation

## 2014-08-06 DIAGNOSIS — Z79899 Other long term (current) drug therapy: Secondary | ICD-10-CM | POA: Insufficient documentation

## 2014-08-06 DIAGNOSIS — M654 Radial styloid tenosynovitis [de Quervain]: Secondary | ICD-10-CM | POA: Diagnosis not present

## 2014-08-06 DIAGNOSIS — M1990 Unspecified osteoarthritis, unspecified site: Secondary | ICD-10-CM | POA: Insufficient documentation

## 2014-08-06 DIAGNOSIS — Z833 Family history of diabetes mellitus: Secondary | ICD-10-CM | POA: Insufficient documentation

## 2014-08-06 DIAGNOSIS — M25531 Pain in right wrist: Secondary | ICD-10-CM | POA: Diagnosis present

## 2014-08-06 HISTORY — PX: DORSAL COMPARTMENT RELEASE: SHX5039

## 2014-08-06 HISTORY — DX: Motion sickness, initial encounter: T75.3XXA

## 2014-08-06 HISTORY — DX: Unspecified osteoarthritis, unspecified site: M19.90

## 2014-08-06 SURGERY — RELEASE, FIRST DORSAL COMPARTMENT, HAND
Anesthesia: General | Laterality: Right | Wound class: Clean

## 2014-08-06 MED ORDER — NORCO 5-325 MG PO TABS: 1.0000 | ORAL_TABLET | Freq: Four times a day (QID) | ORAL | Status: DC | PRN

## 2014-08-06 MED ORDER — ACETAMINOPHEN 160 MG/5ML PO SOLN: 325.0000 mg | ORAL | Status: DC | PRN

## 2014-08-06 MED ORDER — FENTANYL CITRATE (PF) 100 MCG/2ML IJ SOLN
50.0000 ug | INTRAMUSCULAR | Status: DC | PRN
  Administered 2014-08-06: 50 ug via INTRAVENOUS

## 2014-08-06 MED ORDER — ONDANSETRON HCL 4 MG/2ML IJ SOLN
INTRAMUSCULAR | Status: DC | PRN
  Administered 2014-08-06: 4 mg via INTRAVENOUS

## 2014-08-06 MED ORDER — MIDAZOLAM HCL 5 MG/5ML IJ SOLN
INTRAMUSCULAR | Status: DC | PRN
  Administered 2014-08-06: 2 mg via INTRAVENOUS

## 2014-08-06 MED ORDER — DEXAMETHASONE SODIUM PHOSPHATE 4 MG/ML IJ SOLN
INTRAMUSCULAR | Status: DC | PRN
  Administered 2014-08-06: 4 mg via INTRAVENOUS

## 2014-08-06 MED ORDER — GLYCOPYRROLATE 0.2 MG/ML IJ SOLN
INTRAMUSCULAR | Status: DC | PRN
  Administered 2014-08-06: .1 mg via INTRAVENOUS

## 2014-08-06 MED ORDER — OXYCODONE HCL 5 MG/5ML PO SOLN: 5.0000 mg | Freq: Once | ORAL | Status: DC | PRN

## 2014-08-06 MED ORDER — OXYCODONE HCL 5 MG PO TABS: 5.0000 mg | ORAL_TABLET | Freq: Once | ORAL | Status: DC | PRN

## 2014-08-06 MED ORDER — FENTANYL CITRATE (PF) 100 MCG/2ML IJ SOLN
INTRAMUSCULAR | Status: DC | PRN
  Administered 2014-08-06: 50 ug via INTRAVENOUS
  Administered 2014-08-06 (×2): 25 ug via INTRAVENOUS
  Administered 2014-08-06 (×2): 50 ug via INTRAVENOUS

## 2014-08-06 MED ORDER — ACETAMINOPHEN 325 MG PO TABS
325.0000 mg | ORAL_TABLET | ORAL | Status: DC | PRN
Start: 2014-08-06 — End: 2014-08-06
  Administered 2014-08-06: 650 mg via ORAL

## 2014-08-06 MED ORDER — OXYCODONE HCL 5 MG PO TABS
10.0000 mg | ORAL_TABLET | Freq: Four times a day (QID) | ORAL | Status: DC | PRN
  Administered 2014-08-06: 10 mg via ORAL

## 2014-08-06 MED ORDER — KETOROLAC TROMETHAMINE 30 MG/ML IJ SOLN
30.0000 mg | Freq: Once | INTRAMUSCULAR | Status: AC
  Administered 2014-08-06: 30 mg via INTRAVENOUS

## 2014-08-06 MED ORDER — LIDOCAINE HCL (CARDIAC) 20 MG/ML IV SOLN
INTRAVENOUS | Status: DC | PRN
  Administered 2014-08-06: 50 mg via INTRATRACHEAL

## 2014-08-06 MED ORDER — PROPOFOL 10 MG/ML IV BOLUS
INTRAVENOUS | Status: DC | PRN
  Administered 2014-08-06: 80 mg via INTRAVENOUS

## 2014-08-06 MED ORDER — LACTATED RINGERS IV SOLN
INTRAVENOUS | Status: DC
  Administered 2014-08-06: 11:00:00 via INTRAVENOUS

## 2014-08-06 SURGICAL SUPPLY — 29 items
BANDAGE ELASTIC 2 CLIP NS LF (GAUZE/BANDAGES/DRESSINGS) ×3 IMPLANT
BNDG ESMARK 4X12 TAN STRL LF (GAUZE/BANDAGES/DRESSINGS) ×3 IMPLANT
CLOSURE WOUND 1/4X4 (GAUZE/BANDAGES/DRESSINGS) ×1
COVER LIGHT HANDLE FLEXIBLE (MISCELLANEOUS) ×6 IMPLANT
CUFF TOURN SGL QUICK 18 (TOURNIQUET CUFF) ×3 IMPLANT
DURAPREP 26ML APPLICATOR (WOUND CARE) ×3 IMPLANT
GAUZE SPONGE 4X4 12PLY STRL (GAUZE/BANDAGES/DRESSINGS) ×3 IMPLANT
GLOVE BIO SURGEON STRL SZ7.5 (GLOVE) ×3 IMPLANT
GLOVE BIO SURGEON STRL SZ8 (GLOVE) ×6 IMPLANT
GLOVE INDICATOR 8.0 STRL GRN (GLOVE) ×3 IMPLANT
GOWN STRL REUS W/ TWL LRG LVL3 (GOWN DISPOSABLE) ×2 IMPLANT
GOWN STRL REUS W/TWL LRG LVL3 (GOWN DISPOSABLE) ×4
LOOP VESSEL RED MINI 1.3X0.9 (MISCELLANEOUS) IMPLANT
LOOPS RED MINI 1.3MMX0.9MM (MISCELLANEOUS)
NS IRRIG 500ML POUR BTL (IV SOLUTION) ×3 IMPLANT
PACK EXTREMITY ARMC (MISCELLANEOUS) ×3 IMPLANT
PAD GROUND ADULT SPLIT (MISCELLANEOUS) ×3 IMPLANT
PADDING CAST 2X4YD ST (MISCELLANEOUS) ×2
PADDING CAST BLEND 2X4 STRL (MISCELLANEOUS) ×1 IMPLANT
SOL PREP PVP 2OZ (MISCELLANEOUS) ×3
SOLUTION PREP PVP 2OZ (MISCELLANEOUS) ×1 IMPLANT
SPLINT CAST 1 STEP 3X12 (MISCELLANEOUS) ×3 IMPLANT
STOCKINETTE 4X48 STRL (DRAPES) ×3 IMPLANT
STRAP BODY AND KNEE 60X3 (MISCELLANEOUS) ×3 IMPLANT
STRIP CLOSURE SKIN 1/4X4 (GAUZE/BANDAGES/DRESSINGS) ×2 IMPLANT
SUT ETHILON 4-0 (SUTURE) ×2
SUT ETHILON 4-0 FS2 18XMFL BLK (SUTURE) ×1
SUT VICRYL AB 3-0 FS1 BRD 27IN (SUTURE) ×3 IMPLANT
SUTURE ETHLN 4-0 FS2 18XMF BLK (SUTURE) ×1 IMPLANT

## 2014-08-06 NOTE — H&P (Signed)
  H and P reviewed. No changes. Uploaded at later date. 

## 2014-08-06 NOTE — Anesthesia Procedure Notes (Signed)
Procedure Name: LMA Insertion Date/Time: 08/06/2014 11:51 AM Performed by: Mayme Genta Pre-anesthesia Checklist: Patient identified, Emergency Drugs available, Suction available, Timeout performed and Patient being monitored Patient Re-evaluated:Patient Re-evaluated prior to inductionOxygen Delivery Method: Circle system utilized Preoxygenation: Pre-oxygenation with 100% oxygen Intubation Type: IV induction LMA: LMA inserted LMA Size: 4.0 Number of attempts: 1 Placement Confirmation: positive ETCO2 and breath sounds checked- equal and bilateral Tube secured with: Tape

## 2014-08-06 NOTE — Anesthesia Postprocedure Evaluation (Signed)
  Anesthesia Post-op Note  Patient: Hannah [name]  Procedure(s) Performed: Procedure(s): RELEASE DORSAL COMPARTMENT (DEQUERVAIN) (Right)  Anesthesia type:General LMA  Patient location: PACU  Post pain: Pain level controlled  Post assessment: Post-op Vital signs reviewed, Patient's Cardiovascular Status Stable, Respiratory Function Stable, Patent Airway and No signs of Nausea or vomiting  Post vital signs: Reviewed and stable  Last Vitals:  Filed Vitals:   08/06/14 1315  BP: 149/80  Pulse: 57  Temp:   Resp: 11    Level of consciousness: awake, alert  and patient cooperative  Complications: No apparent anesthesia complications

## 2014-08-06 NOTE — Op Note (Signed)
Preoperative diagnosis--de Quervain's tenosynovitis right wrist Postoperative diagnosis--same Procedure-release of stenosed first dorsal compartment right wrist plus limited tenosynovectomy Surgeon--Dr. Caswell Corwin, Jr. Anesthesia--Gen.  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 I had injected the this compartment with steroid and aesthetic in the past. This gave her temporary relief. She is brought in today for open release of her stenosed right wrist first dorsal compartment.  Procedure--the patient was taken the operating room where satisfactory general anesthesia was achieved. A tourniquet was applied to the right upper arm and then her right upper extremity was prepped and draped in usual fashion for a procedure about the wrist. The right upper extremity was exsanguinated and the tourniquet was inflated. About a 2 inch longitudinal incision was made over the dorsoradial aspect of the right wrist. It was centered over the first dorsal compartment. I bluntly dissected down to the first dorsal compartment taking care to protect the sensory nerves. The compartment was opened in a longitudinal fashion. There was a proliferative tenosynovitis involving the first dorsal compartment extensor tendons. The tenosynovium was resected. The wound was irrigated with saline. The tourniquet was released. The tourniquet had been up about 16 minutes. Bleeding was controlled with coagulation cautery. The subcutaneous tissue was closed with 3-0 Vicryl. The skin was closed with a running 4-0 nylon subcuticular suture. Steri-Strips were used to further reinforce the subcuticular closure. A sterile dressing was applied followed by a fiberglass thumb spica splint. The patient was then awakened and transferred to her stretcher bed. She was taken to the recovery room in satisfactory condition. Blood loss was negligible.

## 2014-08-06 NOTE — Discharge Instructions (Signed)
General Anesthesia, Care After Refer to this sheet in the next few weeks. These instructions provide you with information on caring for yourself after your procedure. Your health care provider may also give you more specific instructions. Your treatment has been planned according to current medical practices, but problems sometimes occur. Call your health care provider if you have any problems or questions after your procedure. WHAT TO EXPECT AFTER THE PROCEDURE After the procedure, it is typical to experience:  Sleepiness.  Nausea and vomiting. HOME CARE INSTRUCTIONS  For the first 24 hours after general anesthesia:  Have a responsible person with you.  Do not drive a car. If you are alone, do not take public transportation.  Do not drink alcohol.  Do not take medicine that has not been prescribed by your health care provider.  Do not sign important papers or make important decisions.  You may resume a normal diet and activities as directed by your health care provider.  Change bandages (dressings) as directed.  If you have questions or problems that seem related to general anesthesia, call the hospital and ask for the anesthetist or anesthesiologist on call. SEEK MEDICAL CARE IF:  You have nausea and vomiting that continue the day after anesthesia.  You develop a rash. SEEK IMMEDIATE MEDICAL CARE IF:   You have difficulty breathing.  You have chest pain.  You have any allergic problems. Document Released: 04/30/2000 Document Revised: 01/27/2013 Document Reviewed: 08/07/2012 [facility] Patient Information 2015 [place], [place]. This information is not intended to replace advice given to you by your health care provider. Make sure you discuss any questions you have with your health care provider.  Elevation Ice pack Keep dressing dry RTC in about 10 days Norco for pain

## 2014-08-06 NOTE — Transfer of Care (Signed)
Immediate Anesthesia Transfer of Care Note  Patient: Hannah [name]  Procedure(s) Performed: Procedure(s): RELEASE DORSAL COMPARTMENT (DEQUERVAIN) (Right)  Patient Location: PACU  Anesthesia Type: General LMA  Level of Consciousness: awake, alert  and patient cooperative  Airway and Oxygen Therapy: Patient Spontanous Breathing and Patient connected to supplemental oxygen  Post-op Assessment: Post-op Vital signs reviewed, Patient's Cardiovascular Status Stable, Respiratory Function Stable, Patent Airway and No signs of Nausea or vomiting  Post-op Vital Signs: Reviewed and stable  Complications: No apparent anesthesia complications

## 2014-08-10 ENCOUNTER — Encounter: Payer: Self-pay | Admitting: Unknown Physician Specialty

## 2014-08-11 LAB — SURGICAL PATHOLOGY

## 2015-03-01 ENCOUNTER — Other Ambulatory Visit: Payer: Self-pay | Admitting: Family Medicine

## 2015-03-01 DIAGNOSIS — Z1231 Encounter for screening mammogram for malignant neoplasm of breast: Secondary | ICD-10-CM

## 2015-03-11 ENCOUNTER — Other Ambulatory Visit: Payer: Self-pay | Admitting: Family Medicine

## 2015-03-11 DIAGNOSIS — Z1231 Encounter for screening mammogram for malignant neoplasm of breast: Secondary | ICD-10-CM

## 2015-03-17 ENCOUNTER — Other Ambulatory Visit: Payer: Self-pay | Admitting: Family Medicine

## 2015-03-17 DIAGNOSIS — Z1231 Encounter for screening mammogram for malignant neoplasm of breast: Secondary | ICD-10-CM

## 2015-04-08 ENCOUNTER — Other Ambulatory Visit: Payer: Self-pay | Admitting: Family Medicine

## 2015-04-08 ENCOUNTER — Ambulatory Visit
Admission: RE | Admit: 2015-04-08 | Discharge: 2015-04-08 | Disposition: A | Discharge status: Home / Self Care | Payer: BLUE CROSS/BLUE SHIELD | Source: Ambulatory Visit | Attending: Family Medicine | Admitting: Family Medicine

## 2015-04-08 DIAGNOSIS — Z1231 Encounter for screening mammogram for malignant neoplasm of breast: Secondary | ICD-10-CM

## 2015-04-08 DIAGNOSIS — N6489 Other specified disorders of breast: Secondary | ICD-10-CM | POA: Diagnosis present

## 2015-04-08 DIAGNOSIS — R928 Other abnormal and inconclusive findings on diagnostic imaging of breast: Secondary | ICD-10-CM | POA: Insufficient documentation

## 2016-05-23 ENCOUNTER — Other Ambulatory Visit: Payer: Self-pay | Admitting: Family Medicine

## 2016-05-23 DIAGNOSIS — Z1231 Encounter for screening mammogram for malignant neoplasm of breast: Secondary | ICD-10-CM

## 2016-06-11 ENCOUNTER — Ambulatory Visit
Admission: RE | Admit: 2016-06-11 | Discharge: 2016-06-11 | Disposition: A | Discharge status: Home / Self Care | Payer: BLUE CROSS/BLUE SHIELD | Source: Ambulatory Visit | Attending: Family Medicine | Admitting: Family Medicine

## 2016-06-11 DIAGNOSIS — Z1231 Encounter for screening mammogram for malignant neoplasm of breast: Secondary | ICD-10-CM | POA: Insufficient documentation

## 2017-03-04 ENCOUNTER — Other Ambulatory Visit: Payer: Self-pay | Admitting: Family Medicine

## 2017-03-04 DIAGNOSIS — Z1231 Encounter for screening mammogram for malignant neoplasm of breast: Secondary | ICD-10-CM

## 2017-06-13 ENCOUNTER — Ambulatory Visit: Payer: BLUE CROSS/BLUE SHIELD

## 2017-06-13 ENCOUNTER — Ambulatory Visit
Admission: RE | Admit: 2017-06-13 | Discharge: 2017-06-13 | Disposition: A | Discharge status: Home / Self Care | Payer: BLUE CROSS/BLUE SHIELD | Source: Ambulatory Visit | Attending: Family Medicine | Admitting: Family Medicine

## 2017-06-13 DIAGNOSIS — Z1231 Encounter for screening mammogram for malignant neoplasm of breast: Secondary | ICD-10-CM

## 2018-08-22 ENCOUNTER — Other Ambulatory Visit: Payer: Self-pay | Admitting: Family Medicine

## 2018-08-22 DIAGNOSIS — Z1231 Encounter for screening mammogram for malignant neoplasm of breast: Secondary | ICD-10-CM

## 2018-09-09 ENCOUNTER — Other Ambulatory Visit: Payer: Self-pay

## 2018-09-09 ENCOUNTER — Ambulatory Visit
Admission: RE | Admit: 2018-09-09 | Discharge: 2018-09-09 | Disposition: A | Discharge status: Home / Self Care | Payer: BC Managed Care – PPO | Source: Ambulatory Visit | Attending: Family Medicine | Admitting: Family Medicine

## 2018-09-09 DIAGNOSIS — Z1231 Encounter for screening mammogram for malignant neoplasm of breast: Secondary | ICD-10-CM | POA: Diagnosis not present

## 2019-10-15 ENCOUNTER — Other Ambulatory Visit: Payer: Self-pay | Admitting: Family Medicine

## 2019-10-15 DIAGNOSIS — Z1231 Encounter for screening mammogram for malignant neoplasm of breast: Secondary | ICD-10-CM

## 2019-11-06 ENCOUNTER — Other Ambulatory Visit: Payer: Self-pay

## 2019-11-06 ENCOUNTER — Ambulatory Visit
Admission: RE | Admit: 2019-11-06 | Discharge: 2019-11-06 | Disposition: A | Discharge status: Home / Self Care | Payer: BC Managed Care – PPO | Source: Ambulatory Visit | Attending: Family Medicine | Admitting: Family Medicine

## 2019-11-06 DIAGNOSIS — Z1231 Encounter for screening mammogram for malignant neoplasm of breast: Secondary | ICD-10-CM

## 2020-08-10 ENCOUNTER — Other Ambulatory Visit: Payer: Self-pay | Admitting: Student in an Organized Health Care Education/Training Program

## 2020-08-10 DIAGNOSIS — Z1231 Encounter for screening mammogram for malignant neoplasm of breast: Secondary | ICD-10-CM

## 2020-08-24 ENCOUNTER — Encounter: Payer: Self-pay | Admitting: *Deleted

## 2021-11-03 IMAGING — MG DIGITAL SCREENING BILAT W/ TOMO W/ CAD
8 series · 8 of 24 positions shown · non-contrast
Comparison: Previous exam(s).

CLINICAL DATA: Screening.

EXAM:
DIGITAL SCREENING BILATERAL MAMMOGRAM WITH TOMO AND CAD

[R CC synth-2D]
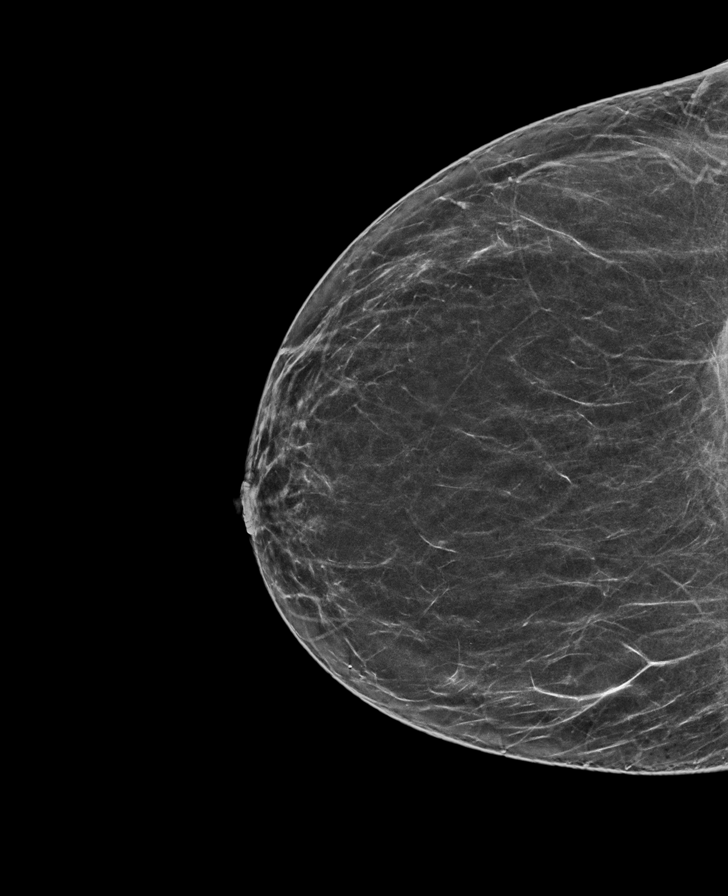

[R MLO synth-2D]
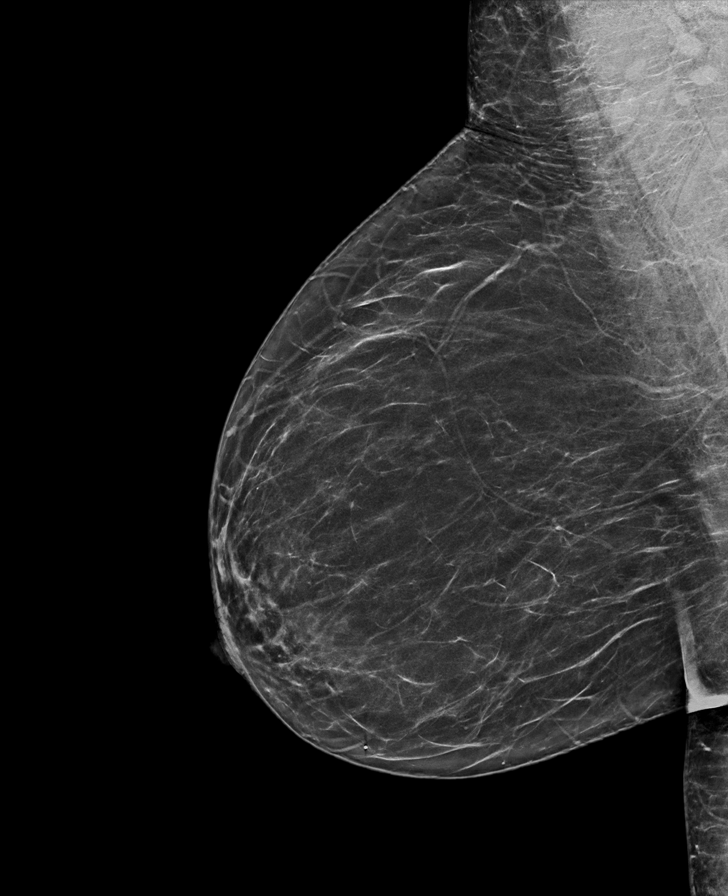

[L CC synth-2D]
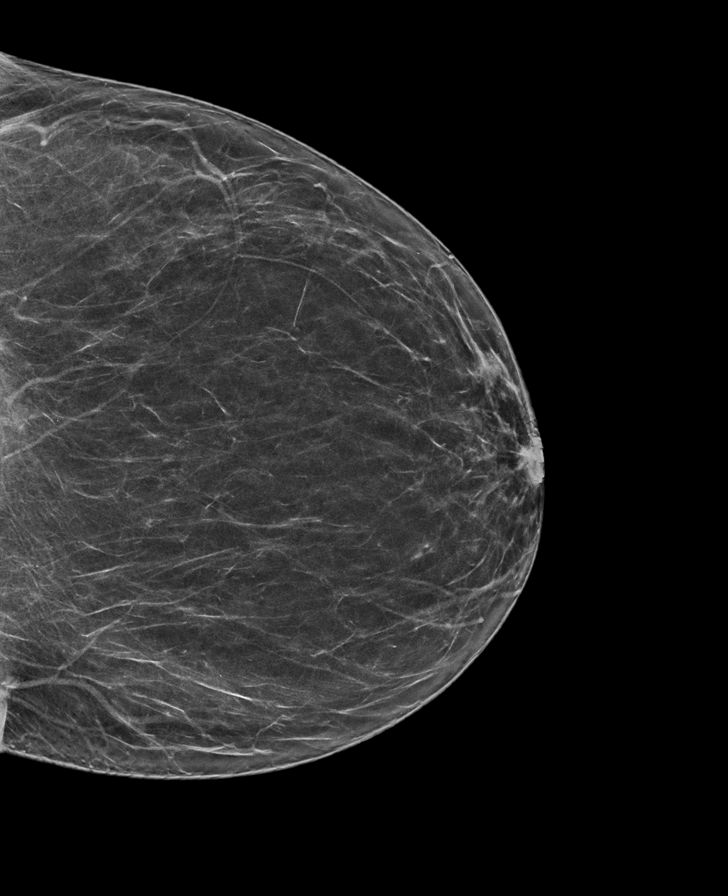

[L MLO synth-2D]
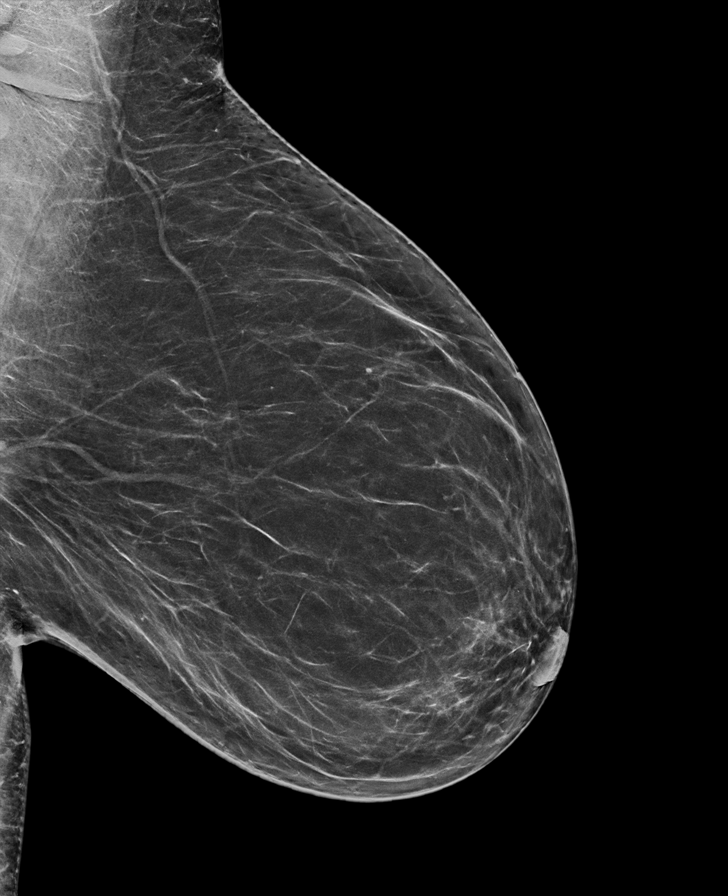

[R CC tomo · tomo slice 32/63.0]
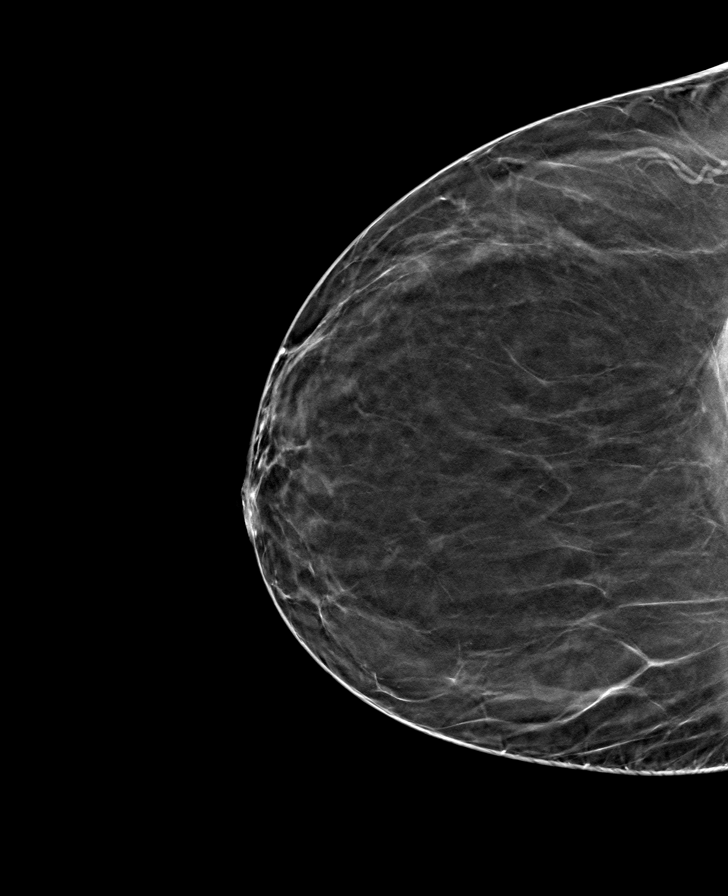

[R MLO tomo · tomo slice 35/70.0]
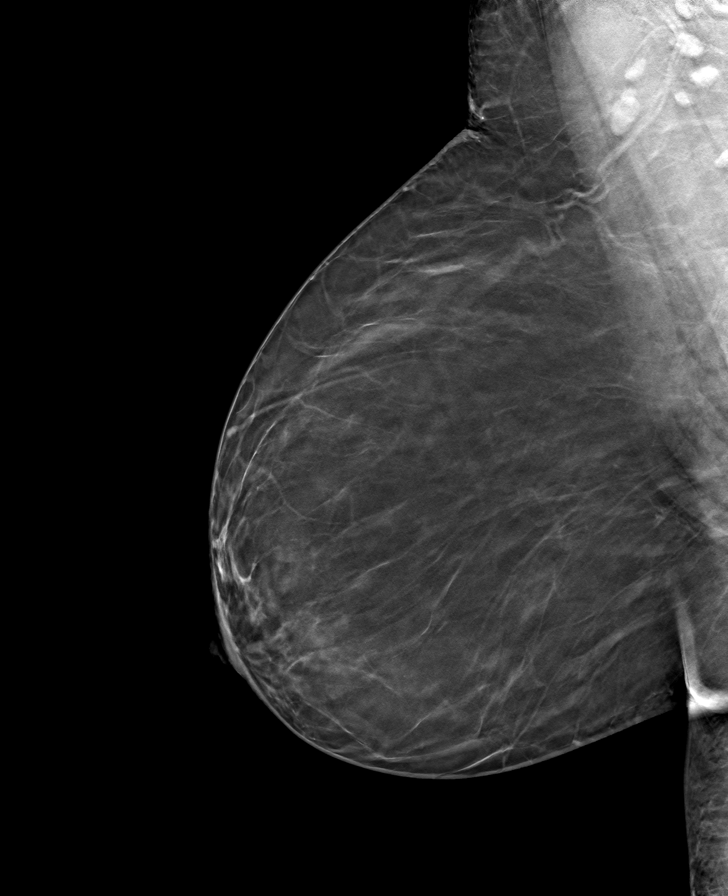

[L CC tomo · tomo slice 32/63.0]
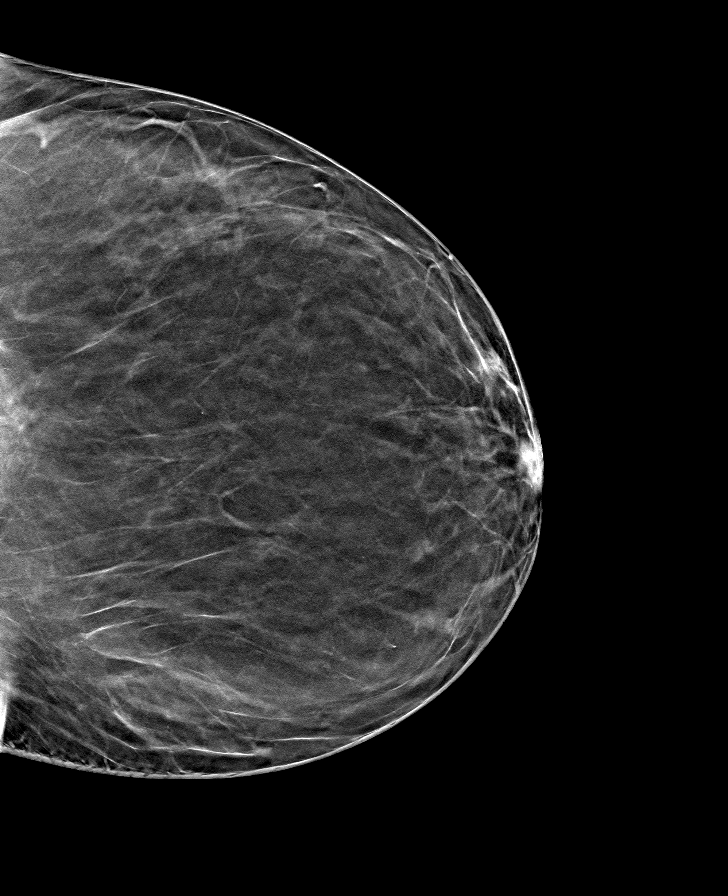

[L MLO tomo · tomo slice 36/71.0]
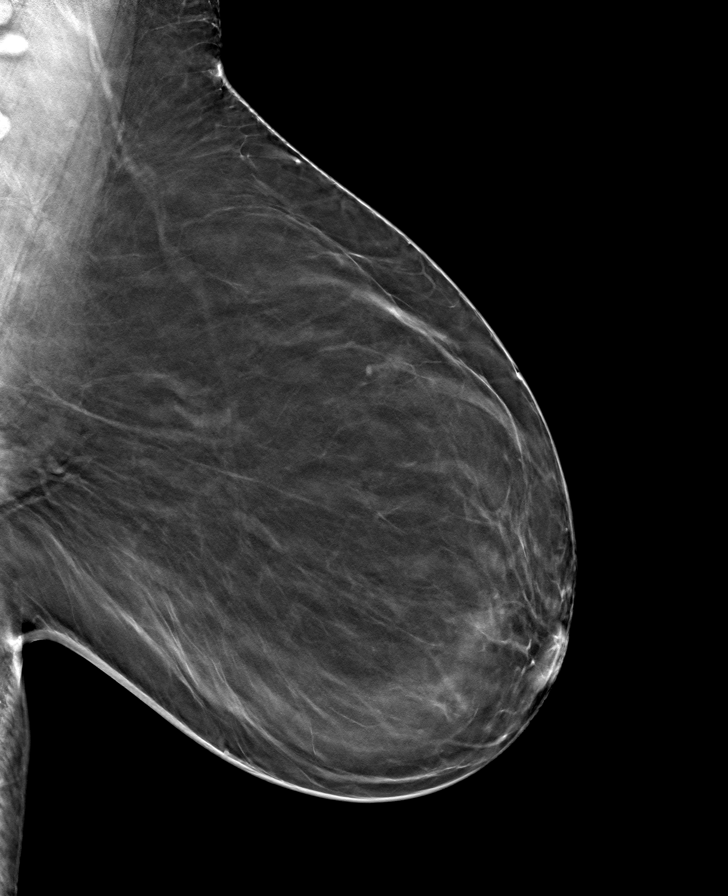

[8 of 24 positions shown; findings below may reference images not displayed]

ACR Breast Density Category b: There are scattered areas of
fibroglandular density.
FINDINGS: There are no findings suspicious for malignancy. Images were
processed with CAD.
IMPRESSION: No mammographic evidence of malignancy. A result letter of this
screening mammogram will be mailed directly to the patient.

RECOMMENDATION:
Screening mammogram in one year. (Code:[id number]-775)

BI-RADS CATEGORY  1: Negative.

## 2022-03-01 ENCOUNTER — Other Ambulatory Visit: Payer: Self-pay | Admitting: Student

## 2022-03-01 DIAGNOSIS — Z1231 Encounter for screening mammogram for malignant neoplasm of breast: Secondary | ICD-10-CM

## 2022-03-12 ENCOUNTER — Ambulatory Visit
Admission: RE | Admit: 2022-03-12 | Discharge: 2022-03-12 | Disposition: A | Discharge status: Home / Self Care | Payer: 59 | Attending: Obstetrics and Gynecology | Admitting: Obstetrics and Gynecology

## 2022-03-12 ENCOUNTER — Other Ambulatory Visit: Payer: Self-pay | Admitting: Student

## 2022-03-12 ENCOUNTER — Ambulatory Visit
Admission: EM | Admit: 2022-03-12 | Discharge: 2022-03-12 | Disposition: A | Discharge status: Home / Self Care | Payer: 59

## 2022-03-12 ENCOUNTER — Ambulatory Visit
Admission: RE | Admit: 2022-03-12 | Discharge: 2022-03-12 | Disposition: A | Discharge status: Home / Self Care | Payer: 59 | Source: Ambulatory Visit | Attending: Obstetrics and Gynecology | Admitting: Obstetrics and Gynecology

## 2022-03-12 DIAGNOSIS — H66003 Acute suppurative otitis media without spontaneous rupture of ear drum, bilateral: Secondary | ICD-10-CM

## 2022-03-12 DIAGNOSIS — M5430 Sciatica, unspecified side: Secondary | ICD-10-CM

## 2022-03-12 DIAGNOSIS — J0141 Acute recurrent pansinusitis: Secondary | ICD-10-CM

## 2022-03-12 DIAGNOSIS — M5136 Other intervertebral disc degeneration, lumbar region: Secondary | ICD-10-CM | POA: Insufficient documentation

## 2022-03-12 MED ORDER — IPRATROPIUM BROMIDE 0.06 % NA SOLN: 2.0000 | Freq: Four times a day (QID) | NASAL | 12 refills | Status: DC

## 2022-03-12 NOTE — ED Triage Notes (Signed)
Pt states she has not been able to blow from RT nostril since last March, pt states her primary has put her on abx, pt reports dizziness today and ringing bilateral ears

## 2022-03-12 NOTE — ED Provider Notes (Signed)
[facility]    CSN: [id number] Arrival date & time: 03/12/22  1454      History   Chief Complaint No chief complaint on file.   HPI Hannah [name] is a 62 y.o. female.   HPI  62 year old female here for evaluation of respiratory complaints.  The patient reports that she has been experiencing nasal congestion since March 2023.  She has been to see her primary care doctor multiple times and did put on multiple rounds of antibiotics without resolution of her symptoms.  She most recently saw her PCP at [facility] 3 days ago and was started on azelastine nasal spray and Augmentin.  Today she developed some mild dizziness and ringing in both of her ears.  She denies any fever or cough.  She has not been to see ear nose and throat.  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, lots of lifting, sore muscles   GERD (gastroesophageal reflux disease)    uses Tums as needed   Motion sickness    boats    Patient Active Problem List   Diagnosis Date Noted   Leiomyoma of uterus, unspecified 11/23/2010    Past Surgical History:  Procedure Laterality Date   ABDOMINAL HYSTERECTOMY  11/27/2010   Procedure: HYSTERECTOMY ABDOMINAL;  Surgeon: Agnes Lawrence, MD;  Location: [facility];  Service: Gynecology;  Laterality: N/A;   CARDIAC CATHETERIZATION  03/23/04   DORSAL COMPARTMENT RELEASE Right 08/06/2014   Procedure: RELEASE DORSAL COMPARTMENT (DEQUERVAIN);  Surgeon: Leanor Kail, MD;  Location: [place];  Service: Orthopedics;  Laterality: Right;   LAPAROSCOPY  11/27/2010   Procedure: LAPAROSCOPY DIAGNOSTIC;  Surgeon: Agnes Lawrence, MD;  Location: [facility];  Service: Gynecology;  Laterality: N/A;   TUBAL LIGATION      OB History   No obstetric history on file.      Home Medications    Prior to Admission medications   Medication Sig Start Date End Date Taking?  Authorizing Provider  amoxicillin-clavulanate (AUGMENTIN) 875-125 MG tablet Take 1 tablet by mouth 2 (two) times daily. 02/27/22  Yes [provider]  ipratropium (ATROVENT) 0.06 % nasal spray Place 2 sprays into both nostrils 4 (four) times daily. 03/12/22  Yes Margarette Canada, NP  tiZANidine (ZANAFLEX) 4 MG tablet Take 4 mg by mouth 3 (three) times daily. 02/27/22  Yes [provider]  calcium carbonate (TUMS - DOSED IN MG ELEMENTAL CALCIUM) 500 MG chewable tablet Chew 1 tablet by mouth as needed for indigestion or heartburn.    [provider]  ibuprofen (ADVIL,MOTRIN) 200 MG tablet Take 200 mg by mouth every 6 (six) hours as needed.    [provider]  Loratadine 10 MG CAPS Take by mouth.    [provider]  NORCO 5-325 MG per tablet Take 1-2 tablets by mouth every 6 (six) hours as needed for moderate pain. MAXIMUM TOTAL ACETAMINOPHEN DOSE IS 4000 MG PER DAY 08/06/14   Leanor Kail, MD    Family History Family History  Problem Relation Age of Onset   Breast cancer Maternal Aunt    Breast cancer Other 78    Social History Social History   Tobacco Use   Smoking status: Never  Vaping Use   Vaping Use: Never used  Substance Use Topics   Alcohol use: Yes    Comment: 5 drinks per year  Drug use: No     Allergies   Patient has no known allergies.   Review of Systems Review of Systems  Constitutional:  Negative for fever.  HENT:  Positive for congestion, rhinorrhea, sinus pressure, sinus pain and tinnitus. Negative for ear pain.   Respiratory:  Negative for cough.   Neurological:  Positive for dizziness.     Physical Exam Triage Vital Signs ED Triage Vitals  Enc Vitals Group     BP 03/12/22 1616 (!) 159/94     Pulse Rate 03/12/22 1616 86     Resp --      Temp 03/12/22 1616 98.1 F (36.7 C)     Temp Source 03/12/22 1616 Oral     SpO2 03/12/22 1616 99 %     Weight 03/12/22 1614 194 lb (88 kg)     Height 03/12/22 1614 '5\' 7"'$   (1.702 m)     Head Circumference --      Peak Flow --      Pain Score 03/12/22 1614 10     Pain Loc --      Pain Edu? --      Excl. in [place]? --    No data found.  Updated Vital Signs BP (!) 159/94 (BP Location: Left Arm)   Pulse 86   Temp 98.1 F (36.7 C) (Oral)   Ht '5\' 7"'$  (1.702 m)   Wt 194 lb (88 kg)   LMP 08/27/2010   SpO2 99%   BMI 30.38 kg/m   Visual Acuity Right Eye Distance:   Left Eye Distance:   Bilateral Distance:    Right Eye Near:   Left Eye Near:    Bilateral Near:     Physical Exam Vitals and nursing note reviewed.  Constitutional:      Appearance: Normal appearance. She is not ill-appearing.  HENT:     Head: Normocephalic and atraumatic.     Right Ear: Ear canal and external ear normal. There is no impacted cerumen.     Left Ear: Ear canal and external ear normal. There is no impacted cerumen.     Ears:     Comments: Bilateral tympanic membranes are erythematous and injected.    Nose: Congestion and rhinorrhea present.     Comments: Nasal mucosa is erythematous and edematous with milky white discharge in the right nasal passage.  Right frontal maxillary sinuses are also markedly tender to compression.  The left is unremarkable.    Mouth/Throat:     Mouth: Mucous membranes are moist.     Pharynx: Oropharynx is clear. No oropharyngeal exudate or posterior oropharyngeal erythema.  Musculoskeletal:     Cervical back: Normal range of motion and neck supple.  Lymphadenopathy:     Cervical: No cervical adenopathy.  Skin:    General: Skin is warm and dry.     Capillary Refill: Capillary refill takes less than 2 seconds.  Neurological:     General: No focal deficit present.     Mental Status: She is alert and oriented to person, place, and time.  Psychiatric:        Mood and Affect: Mood normal.        Behavior: Behavior normal.        Thought Content: Thought content normal.        Judgment: Judgment normal.      UC Treatments / Results   Labs (all labs ordered are listed, but only abnormal results are displayed) Labs Reviewed - No data to display  EKG   Radiology No results found.  Procedures Procedures (including critical care time)  Medications Ordered in UC Medications - No data to display  Initial Impression / Assessment and Plan / UC Course  I have reviewed the triage vital signs and the nursing notes.  Pertinent labs & imaging results that were available during my care of the patient were reviewed by me and considered in my medical decision making (see chart for details).   Patient is a very pleasant, nontoxic-appearing 62 year old female here for evaluation of ongoing nasal congestion with new tinnitus in both ears and mild intermittent dizziness.  She is not dizzy at present.  She is currently on Augmentin 875 twice daily and she has been taking it for the last 3 days.  Today she developed ringing in both of her ears and also some intermittent dizziness but is not dizzy at present.  On exam she has erythematous and injected tympanic membranes bilaterally.  This is consistent with otitis media and I suspect this is the cause of her dizziness.  She does have inflamed nasal mucosa with thick milky discharge on the right side with tenderness to compression of the right frontal and maxillary sinuses.  I have advised her to continue taking the Augmentin as prescribed.  We discussed doing sinus irrigation to help alleviate some of the mucus burden.  I also told her to hold on the azelastine nasal spray and I will switch her to Atrovent nasal spray that she can use 4 times a day to help with the congestion and mucus production.  I also advised her to talk to her PCP about a referral to ear nose and throat seeing is how she has been on multiple rounds of antibiotics and her symptoms have not resolved.   Final Clinical Impressions(s) / UC Diagnoses   Final diagnoses:  Acute recurrent pansinusitis  Non-recurrent acute  suppurative otitis media of both ears without spontaneous rupture of tympanic membranes     Discharge Instructions      The Augmentin twice daily with food for 10 days for treatment of your sinusitis.  Perform sinus irrigation 2-3 times a day with a NeilMed sinus rinse kit and distilled water.  Do not use tap water.  You can use plain over-the-counter Mucinex every 6 hours to break up the stickiness of the mucus so your body can clear it.  Increase your oral fluid intake to thin out your mucus so that is also able for your body to clear more easily.  Take an over-the-counter probiotic, such as Culturelle-align-activia, 1 hour after each dose of antibiotic to prevent diarrhea.  Use the Atrovent nasal spray, 2 squirts in each nostril every 6 hours as needed for congestion.  Take an over-the-counter probiotic 1 hour after each dose of antibiotic to prevent diarrhea.  Use over-the-counter Tylenol and ibuprofen as needed for pain or fever.  Place a hot water bottle, or heating pad, underneath your pillowcase at night to help dilate up your ear and aid in pain relief as well as resolution of the infection.  Return for reevaluation for any new or worsening symptoms.   If you develop any new or worsening symptoms return for reevaluation or see your primary care provider.      ED Prescriptions     Medication Sig Dispense Auth. Provider   ipratropium (ATROVENT) 0.06 % nasal spray Place 2 sprays into both nostrils 4 (four) times daily. 15 mL Margarette Canada, NP      PDMP  not reviewed this encounter.   Margarette Canada, NP 03/12/22 [phone]

## 2022-03-12 NOTE — Discharge Instructions (Addendum)
The Augmentin twice daily with food for 10 days for treatment of your sinusitis.  Perform sinus irrigation 2-3 times a day with a NeilMed sinus rinse kit and distilled water.  Do not use tap water.  You can use plain over-the-counter Mucinex every 6 hours to break up the stickiness of the mucus so your body can clear it.  Increase your oral fluid intake to thin out your mucus so that is also able for your body to clear more easily.  Take an over-the-counter probiotic, such as Culturelle-align-activia, 1 hour after each dose of antibiotic to prevent diarrhea.  Use the Atrovent nasal spray, 2 squirts in each nostril every 6 hours as needed for congestion.  Take an over-the-counter probiotic 1 hour after each dose of antibiotic to prevent diarrhea.  Use over-the-counter Tylenol and ibuprofen as needed for pain or fever.  Place a hot water bottle, or heating pad, underneath your pillowcase at night to help dilate up your ear and aid in pain relief as well as resolution of the infection.  Return for reevaluation for any new or worsening symptoms.   If you develop any new or worsening symptoms return for reevaluation or see your primary care provider.

## 2022-03-20 ENCOUNTER — Ambulatory Visit
Admission: RE | Admit: 2022-03-20 | Discharge: 2022-03-20 | Disposition: A | Discharge status: Home / Self Care | Payer: 59 | Source: Ambulatory Visit | Attending: Obstetrics and Gynecology | Admitting: Obstetrics and Gynecology

## 2022-03-20 DIAGNOSIS — Z1231 Encounter for screening mammogram for malignant neoplasm of breast: Secondary | ICD-10-CM | POA: Diagnosis present

## 2022-04-03 ENCOUNTER — Inpatient Hospital Stay
Admission: RE | Admit: 2022-04-03 | Discharge: 2022-04-03 | Disposition: A | Discharge status: Home / Self Care | Payer: Self-pay | Source: Ambulatory Visit | Attending: *Deleted | Admitting: *Deleted

## 2022-04-03 ENCOUNTER — Other Ambulatory Visit: Payer: Self-pay | Admitting: *Deleted

## 2022-04-03 DIAGNOSIS — Z1231 Encounter for screening mammogram for malignant neoplasm of breast: Secondary | ICD-10-CM

## 2022-08-14 ENCOUNTER — Other Ambulatory Visit: Payer: Self-pay | Admitting: Student

## 2022-08-14 DIAGNOSIS — J019 Acute sinusitis, unspecified: Secondary | ICD-10-CM

## 2022-08-21 ENCOUNTER — Ambulatory Visit
Admission: RE | Admit: 2022-08-21 | Discharge: 2022-08-21 | Disposition: A | Discharge status: Home / Self Care | Payer: 59 | Source: Ambulatory Visit

## 2022-08-21 DIAGNOSIS — J019 Acute sinusitis, unspecified: Secondary | ICD-10-CM

## 2022-08-30 ENCOUNTER — Other Ambulatory Visit: Payer: Self-pay | Admitting: Student

## 2022-08-30 DIAGNOSIS — J349 Unspecified disorder of nose and nasal sinuses: Secondary | ICD-10-CM

## 2022-09-14 ENCOUNTER — Encounter: Payer: Self-pay | Admitting: Student

## 2022-09-18 ENCOUNTER — Ambulatory Visit
Admission: RE | Admit: 2022-09-18 | Discharge: 2022-09-18 | Disposition: A | Discharge status: Home / Self Care | Payer: 59 | Source: Ambulatory Visit

## 2022-09-18 ENCOUNTER — Ambulatory Visit
Admission: RE | Admit: 2022-09-18 | Discharge: 2022-09-18 | Disposition: A | Discharge status: Home / Self Care | Payer: Medicaid Other | Source: Ambulatory Visit

## 2022-09-18 DIAGNOSIS — J349 Unspecified disorder of nose and nasal sinuses: Secondary | ICD-10-CM

## 2022-09-18 MED ORDER — GADOPICLENOL 0.5 MMOL/ML IV SOLN
7.5000 mL | Freq: Once | INTRAVENOUS | Status: AC | PRN
  Administered 2022-09-18: 7.5 mL via INTRAVENOUS

## 2022-10-01 ENCOUNTER — Other Ambulatory Visit: Payer: Self-pay

## 2022-10-01 ENCOUNTER — Encounter: Payer: Self-pay | Admitting: Emergency Medicine

## 2022-10-01 ENCOUNTER — Ambulatory Visit
Admission: EM | Admit: 2022-10-01 | Discharge: 2022-10-01 | Disposition: A | Discharge status: Home / Self Care | Payer: 59 | Attending: Internal Medicine | Admitting: Internal Medicine

## 2022-10-01 DIAGNOSIS — R739 Hyperglycemia, unspecified: Secondary | ICD-10-CM

## 2022-10-01 DIAGNOSIS — R631 Polydipsia: Secondary | ICD-10-CM | POA: Diagnosis not present

## 2022-10-01 LAB — GLUCOSE, CAPILLARY: Glucose-Capillary: 467 mg/dL — ABNORMAL HIGH (ref 70–99)

## 2022-10-01 NOTE — Discharge Instructions (Signed)
Patient to the ER for further evaluation of elevated blood sugars

## 2022-10-01 NOTE — ED Provider Notes (Signed)
[facility]    CSN: [id number] Arrival date & time: 10/01/22  1254      History   Chief Complaint Chief Complaint  Patient presents with   Hyperglycemia    HPI Hannah [name] is a 62 y.o. female presents for evaluation of polyuria, polydipsia for 1 week.  She endorses an insatiable thirst but denies history of diabetes.  Denies polyphasia.  States she had a home blood glucose of 400+.  Lab review shows in 2021 her hemoglobin A1c was 6.9.  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  Does report a strong family history of diabetes.  No chest pain shortness of breath.  She does endorse a lot of fatigue.   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, lots of lifting, sore muscles   GERD (gastroesophageal reflux disease)    uses Tums as needed   Motion sickness    boats    Patient Active Problem List   Diagnosis Date Noted   Leiomyoma of uterus, unspecified 11/23/2010    Past Surgical History:  Procedure Laterality Date   ABDOMINAL HYSTERECTOMY  11/27/2010   Procedure: HYSTERECTOMY ABDOMINAL;  Surgeon: [name], MD;  Location: [facility];  Service: Gynecology;  Laterality: N/A;   CARDIAC CATHETERIZATION  03/23/04   DORSAL COMPARTMENT RELEASE Right 08/06/2014   Procedure: RELEASE DORSAL COMPARTMENT (DEQUERVAIN);  Surgeon: Erin Sons, MD;  Location: [facility];  Service: Orthopedics;  Laterality: Right;   LAPAROSCOPY  11/27/2010   Procedure: LAPAROSCOPY DIAGNOSTIC;  Surgeon: [name], MD;  Location: [facility];  Service: Gynecology;  Laterality: N/A;   TUBAL LIGATION      OB History   No obstetric history on file.      Home Medications    Prior to Admission medications   Medication Sig Start Date End Date Taking? Authorizing Provider   amoxicillin-clavulanate (AUGMENTIN) 875-125 MG tablet Take 1 tablet by mouth 2 (two) times daily. Patient not taking: Reported on 10/01/2022 02/27/22   [provider]  calcium carbonate (TUMS - DOSED IN MG ELEMENTAL CALCIUM) 500 MG chewable tablet Chew 1 tablet by mouth as needed for indigestion or heartburn. Patient not taking: Reported on 10/01/2022    [provider]  ibuprofen (ADVIL,MOTRIN) 200 MG tablet Take 200 mg by mouth every 6 (six) hours as needed.    [provider]  ipratropium (ATROVENT) 0.06 % nasal spray Place 2 sprays into both nostrils 4 (four) times daily. Patient not taking: Reported on 10/01/2022 03/12/22   Becky Augusta, NP  Loratadine 10 MG CAPS Take by mouth. Patient not taking: Reported on 10/01/2022    [provider]  NORCO 5-325 MG per tablet Take 1-2 tablets by mouth every 6 (six) hours as needed for moderate pain. MAXIMUM TOTAL ACETAMINOPHEN DOSE IS 4000 MG PER DAY Patient not taking: Reported on 10/01/2022 08/06/14   Erin Sons, MD  tiZANidine (ZANAFLEX) 4 MG tablet Take 4 mg by mouth 3 (three) times daily. Patient not taking: Reported on 10/01/2022 02/27/22   [provider]    Family History Family History  Problem Relation Age of Onset   Breast cancer Maternal Aunt    Breast cancer Other 24    Social History Social History   Tobacco  Use   Smoking status: Never  Vaping Use   Vaping status: Never Used  Substance Use Topics   Alcohol use: Yes    Comment: 5 drinks per year   Drug use: No     Allergies   Patient has no known allergies.   Review of Systems Review of Systems  Constitutional:  Positive for fatigue.  Endocrine: Positive for polydipsia and polyuria.     Physical Exam Triage Vital Signs ED Triage Vitals  Encounter Vitals Group     BP 10/01/22 1309 120/78     Systolic BP Percentile --      Diastolic BP Percentile --      Pulse Rate 10/01/22 1309 82     Resp 10/01/22 1309 20      Temp 10/01/22 1309 98.5 F (36.9 C)     Temp Source 10/01/22 1309 Oral     SpO2 10/01/22 1309 100 %     Weight --      Height --      Head Circumference --      Peak Flow --      Pain Score 10/01/22 1304 7     Pain Loc --      Pain Education --      Exclude from Growth Chart --    No data found.  Updated Vital Signs BP 120/78 (BP Location: Left Arm)   Pulse 82   Temp 98.5 F (36.9 C) (Oral)   Resp 20   LMP 08/27/2010   SpO2 100%   Visual Acuity Right Eye Distance:   Left Eye Distance:   Bilateral Distance:    Right Eye Near:   Left Eye Near:    Bilateral Near:     Physical Exam Vitals (Appears fatigued) and nursing note reviewed.  Constitutional:      General: She is not in acute distress.    Appearance: She is normal weight. She is not toxic-appearing or diaphoretic.  HENT:     Head: Normocephalic and atraumatic.  Eyes:     Pupils: Pupils are equal, round, and reactive to light.  Cardiovascular:     Rate and Rhythm: Normal rate.  Pulmonary:     Effort: Pulmonary effort is normal.  Skin:    General: Skin is warm and dry.  Neurological:     General: No focal deficit present.     Mental Status: She is alert and oriented to person, place, and time.  Psychiatric:        Mood and Affect: Mood normal.        Behavior: Behavior normal.      UC Treatments / Results  Labs (all labs ordered are listed, but only abnormal results are displayed) Labs Reviewed  GLUCOSE, CAPILLARY - Abnormal; Notable for the following components:      Result Value   Glucose-Capillary 467 (*)    All other components within normal limits    EKG   Radiology No results found.  Procedures Procedures (including critical care time)  Medications Ordered in UC Medications - No data to display  Initial Impression / Assessment and Plan / UC Course  I have reviewed the triage vital signs and the nursing notes.  Pertinent labs & imaging results that were available during my care  of the patient were reviewed by me and considered in my medical decision making (see chart for details).     Point-of-care blood glucose 467 in clinic.  Patient with polydipsia, patient had x 1 week  with prior history of diabetes per lab review.  Given her blood sugar readings and symptoms I advise she go to the emergency room for further evaluation and treatment.  She declined EMS transfer and will go with her family member driving her to [facility] ER.  She was instructed to pull over and call 911 for any worsening symptoms that occur in transit and she verbalized understanding.  Vital signs are stable on discharge. Final Clinical Impressions(s) / UC Diagnoses   Final diagnoses:  Hyperglycemia  Polydipsia     Discharge Instructions      Patient to the ER for further evaluation of elevated blood sugars    ED Prescriptions   None    PDMP not reviewed this encounter.   Radford Pax, NP 10/01/22 1326

## 2022-10-01 NOTE — ED Notes (Signed)
Patient is being discharged from the Urgent Care and sent to the Emergency Department via POV . Per Cheri Rous, NP, patient is in need of higher level of care due to new onset diabetes. Patient is aware and verbalizes understanding of plan of care.  Vitals:   10/01/22 1309  BP: 120/78  Pulse: 82  Resp: 20  Temp: 98.5 F (36.9 C)  SpO2: 100%

## 2022-10-01 NOTE — ED Triage Notes (Signed)
Patient has been thirsty and frequent urination for a week.  Has a headache.  Patient is not a diabetic, but has had cbg performed 400 +

## 2022-10-15 ENCOUNTER — Other Ambulatory Visit: Payer: Self-pay | Admitting: Otolaryngology

## 2022-10-16 ENCOUNTER — Encounter: Payer: Self-pay | Admitting: Otolaryngology

## 2022-10-16 NOTE — Anesthesia Preprocedure Evaluation (Addendum)
Anesthesia Evaluation  Patient identified by MRN, date of birth, ID band Patient awake    Reviewed: Allergy & Precautions, H&P , NPO status , Patient's Chart, lab work & pertinent test results  Airway Mallampati: I  TM Distance: >3 FB Neck ROM: Full    Dental no notable dental hx.    Pulmonary neg pulmonary ROS   Pulmonary exam normal breath sounds clear to auscultation       Cardiovascular negative cardio ROS Normal cardiovascular exam Rhythm:Regular Rate:Normal     Neuro/Psych negative neurological ROS  negative psych ROS   GI/Hepatic Neg liver ROS,GERD  ,,  Endo/Other  diabetes    Renal/GU negative Renal ROS  negative genitourinary   Musculoskeletal  (+) Arthritis ,    Abdominal   Peds negative pediatric ROS (+)  Hematology negative hematology ROS (+)   Anesthesia Other Findings Chest pain, atypical  GERD (gastroesophageal reflux disease) Arthritis  Motion sickness Newly diagnosed diabetes (HCC) Bilateral low back pain without sciatica  Hx of bronchitis    Reproductive/Obstetrics negative OB ROS                             Anesthesia Physical Anesthesia Plan  ASA: 2  Anesthesia Plan: General ETT   Post-op Pain Management: Ofirmev IV (intra-op) and Oxycodone PO   Induction: Intravenous  PONV Risk Score and Plan: 3 and Ondansetron, Dexamethasone and Treatment may vary due to age or medical condition  Airway Management Planned: Oral ETT  Additional Equipment:   Intra-op Plan:   Post-operative Plan: Extubation in OR  Informed Consent: I have reviewed the patients History and Physical, chart, labs and discussed the procedure including the risks, benefits and alternatives for the proposed anesthesia with the patient or authorized representative who has indicated his/her understanding and acceptance.     Dental Advisory Given  Plan Discussed with: Anesthesiologist,  CRNA and Surgeon  Anesthesia Plan Comments: (Patient consented for risks of anesthesia including but not limited to:  - adverse reactions to medications - damage to eyes, teeth, lips or other oral mucosa - nerve damage due to positioning  - sore throat or hoarseness - Damage to heart, brain, nerves, lungs, other parts of body or loss of life  Patient voiced understanding.)       Anesthesia Quick Evaluation

## 2022-10-19 NOTE — Discharge Instructions (Signed)
[facility] ENDOSCOPIC SINUS SURGERY [facility]  What is Functional Endoscopic Sinus Surgery?  The Surgery involves making the natural openings of the sinuses larger by removing the bony partitions that separate the sinuses from the nasal cavity.  The natural sinus lining is preserved as much as possible to allow the sinuses to resume normal function after the surgery.  In some patients nasal polyps (excessively swollen lining of the sinuses) may be removed to relieve obstruction of the sinus openings.  The surgery is performed through the nose using lighted scopes, which eliminates the need for incisions on the face.  A septoplasty is a different procedure which is sometimes performed with sinus surgery.  It involves straightening the boy partition that separates the two sides of your nose.  A crooked or deviated septum may need repair if is obstructing the sinuses or nasal airflow.  Turbinate reduction is also often performed during sinus surgery.  The turbinates are bony proturberances from the side walls of the nose which swell and can obstruct the nose in patients with sinus and allergy problems.  Their size can be surgically reduced to help relieve nasal obstruction.  What Can Sinus Surgery Do For Me?  Sinus surgery can reduce the frequency of sinus infections requiring antibiotic treatment.  This can provide improvement in nasal congestion, post-nasal drainage, facial pressure and nasal obstruction.  Surgery will NOT prevent you from ever having an infection again, so it usually only for patients who get infections 4 or more times yearly requiring antibiotics, or for infections that do not clear with antibiotics.  It will not cure nasal allergies, so patients with allergies may still require medication to treat their allergies after surgery. Surgery may improve headaches related to sinusitis, however, some people will continue to  require medication to control sinus headaches related to allergies.  Surgery will do nothing for other forms of headache (migraine, tension or cluster).  What Are the Risks of Endoscopic Sinus Surgery?  Current techniques allow surgery to be performed safely with little risk, however, there are rare complications that patients should be aware of.  Because the sinuses are located around the eyes, there is risk of eye injury, including blindness, though again, this would be quite rare. This is usually a result of bleeding behind the eye during surgery, which can effect vision, though there are treatments to protect the vision and prevent permanent injury. More serious complications would include bleeding inside the brain cavity or damage to the brain.This happens when the fluid around the brain leaks out into the sinus cavity.  Again, all of these complications are uncommon, and spinal fluid leaks can be safely managed surgically if they occur.  The most common complication of sinus surgery is bleeding from the nose, which may require packing or cauterization of the nose.  Patients with polyps may experience recurrence of the polyps that would require revision surgery.  Alterations of sense of smell or injury to the tear ducts are also rare complications.   What is the Surgery Like, and what is the Recovery?  The Surgery usually takes a couple of hours to perform, and is usually performed under a general anesthetic (completely asleep).  Patients are usually discharged home after a couple of hours.  Sometimes during surgery it is necessary to pack the nose to control bleeding, and the packing is left in place for 24 - 48 hours, and removed by your surgeon.  If  a septoplasty was performed during the procedure, there is often a splint placed which must be removed after 5-7 days.   Discomfort: Pain is usually mild to moderate, and can be controlled by prescription pain medication or acetaminophen (Tylenol).   Aspirin, Ibuprofen (Advil, Motrin), or Naprosyn (Aleve) should be avoided, as they can cause increased bleeding.  Most patients feel sinus pressure like they have a bad head cold for several days.  Sleeping with your head elevated can help reduce swelling and facial pressure, as can ice packs over the face.  A humidifier may be helpful to keep the mucous and blood from drying in the nose.   Diet: There are no specific diet restrictions, however, you should generally start with clear liquids and a light diet of bland foods because the anesthetic can cause some nausea.  Advance your diet depending on how your stomach feels.  Taking your pain medication with food will often help reduce stomach upset which pain medications can cause.  Nasal Saline Irrigation: It is important to remove blood clots and dried mucous from the nose as it is healing.  This is done by having you irrigate the nose at least 3 - 4 times daily with a salt water solution.  We recommend using NeilMed Sinus Rinse (available at the drug store).  Fill the squeeze bottle with the solution, bend over a sink, and insert the tip of the squeeze bottle into the nose  of an inch.  Point the tip of the squeeze bottle towards the inside corner of the eye on the same side your irrigating.  Squeeze the bottle and gently irrigate the nose.  If you bend forward as you do this, most of the fluid will flow back out of the nose, instead of down your throat.   The solution should be warm, near body temperature, when you irrigate.   Each time you irrigate, you should use a full squeeze bottle.   Note that if you are instructed to use Nasal Steroid Sprays at any time after your surgery, irrigate with saline BEFORE using the steroid spray, so you do not wash it all out of the nose. Another product, Nasal Saline Gel (such as AYR Nasal Saline Gel) can be applied in each nostril 3 - 4 times daily to moisture the nose and reduce scabbing or crusting.  Bleeding:   Bloody drainage from the nose can be expected for several days, and patients are instructed to irrigate their nose frequently with salt water to help remove mucous and blood clots.  The drainage may be dark red or brown, though some fresh blood may be seen intermittently, especially after irrigation.  Do not blow you nose, as bleeding may occur. If you must sneeze, keep your mouth open to allow air to escape through your mouth.  If heavy bleeding occurs: Irrigate the nose with saline to rinse out clots, then spray the nose 3 - 4 times with Afrin Nasal Decongestant Spray.  The spray will constrict the blood vessels to slow bleeding.  Pinch the lower half of your nose shut to apply pressure, and lay down with your head elevated.  Ice packs over the nose may help as well. If bleeding persists despite these measures, you should notify your doctor.  Do not use the Afrin routinely to control nasal congestion after surgery, as it can result in worsening congestion and may affect healing.     Activity: Return to work varies among patients. Most patients will be out  of work at least 5 - 7 days to recover.  Patient may return to work after they are off of narcotic pain medication, and feeling well enough to perform the functions of their job.  Patients must avoid heavy lifting (over 10 pounds) or strenuous physical for 2 weeks after surgery, so your employer may need to assign you to light duty, or keep you out of work longer if light duty is not possible.  NOTE: you should not drive, operate dangerous machinery, do any mentally demanding tasks or make any important legal or financial decisions while on narcotic pain medication and recovering from the general anesthetic.    Call Your Doctor Immediately if You Have Any of the Following: Bleeding that you cannot control with the above measures Loss of vision, double vision, bulging of the eye or black eyes. Fever over 101 degrees Neck stiffness with severe headache,  fever, nausea and change in mental state. You are always encouraged to call anytime with concerns, however, please call with requests for pain medication refills during office hours.  Office Endoscopy: During follow-up visits your doctor will remove any packing or splints that may have been placed and evaluate and clean your sinuses endoscopically.  Topical anesthetic will be used to make this as comfortable as possible, though you may want to take your pain medication prior to the visit.  How often this will need to be done varies from patient to patient.  After complete recovery from the surgery, you may need follow-up endoscopy from time to time, particularly if there is concern of recurrent infection or nasal polyps.

## 2022-10-24 NOTE — H&P (Addendum)
HPI: This is a 62 year old female who is being seen for a chief complaint of sinusitis, with symptoms noticed in the right side of the nose, the right side of the face, the right side of the head, and the right side of the brow. She has symptoms that are chronic. She has sinusitis that is worsening and moderate in severity. She has associated allergies, facial pain ( in the right periorbital area, right cheek, and right brow), facial pressure (right malar, right brow, and right periorbital), foul smell, headache, itchy eyes, itchy nose, nasal obstruction (the right side), post-nasal drip (moderate described as colored mucus - brown and colored mucus - yellow), sneezing, and throat pain (mild, dull pain). The symptoms have been present for 1 year and 6 months. The symptoms developed gradually (years). She states she has had 4 episodes of sinusitis in the last year. She is currently using the following treatment(s): antibiotics. She has had the following treatment(s) in the past: saline irrigations, antibiotics (Doxycycline - did not help, Amoxicillin - did not help, and Augmentin - did not help), antihistamines (Xyzal - did not help, Claritin - did not help, and Zyrtec - did not help), nasal steroids (Flonase - did not help and Nasacort - did not help), steroids (prednisone - did not help), and over the counter medications (pseudoephedrine - did not help and Mucinex - did not help). She has had the following treatment(s) in the past year: 4 courses of antibiotics and 1 courses of steroids. Patient states she has been treated for chronic sinus infection for the past year. Patient states she has been on two rounds of Augmentin, did not help, two rounds of Amoxicillin, did not help and one round of prednisone did not help. Patient states that she has tired saline irrigations but saline was not able to pass through nostrils. Patient states she tries to gently pop her ears to get pressure but she is  not successful. Patient states the pressure and pain is all on her right side. Patient states she has not noticed any changes in her hearing on the right side. Patient states it feels like she is full of mucus on the right side but it does not come out. Patient states she has tried decongestants but they have not helped. Patient states the throat pain is on the right side but no issues with swallowing. Patient states her mucus is thick, yellow, green to brown in color. ---- Ms. Merkling is a very pleasant 62 year old woman with history of right sided nasal congestion, pressure, pain and intermittent drainage for almost a year. She has been treated with oral abx but with no improvement. She does have a history of significant allergies including mold in her past and has been diagnosed with asthma as well for which she has an albuterol inhaler for as needed use. She is currently undergoing eval with her PCP for possible type II DM and has been monitoring her glucose carefully. CT and MRI were reviewed. Evidence of large expansile process / mass filling the right maxillary sinus. There was secondary septal deviation toward the left side as well as mucosal thickening and opacification of the right frontal and ethmoid sinuses as well. The sphenoid sinuses were clear bilaterally. No history of allergy shots or immunotherapy. She is currently on Flonase, but has some difficulty with using it on the right side. No history epistaxis. No fevers, NS, weight loss. Non smoker throughout her life. 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 Vitals: Date Taken By B.P. Pulse Resp. O2 Sat. Temp. Ht. Wt. BMI BSA 10/03/22 15:35 [place] Rad 97.5 F 75.0 in 173.0 lbs 21.6 2.1 FiO2 * Patient Reported Exam: A Comprehensive otolaryngologic exam was performed Comprehensive otolaryngologic exam External Ears: external ear examination of normal size and morphology without traumatic or  congenital deformity AD, external ear examination of normal size and morphology without traumatic or congenital Visit Note - October 03, 2022 JATZIRI, BANNAN MRN: [id number] DOB: 1961-01-19 Sex: Female PMS ID: [id number] Adron Bene (Primary Provider) [facility] Under) Page 2 [phone] Work [phone] Fax [facility] [place] [address] [place], [place] [address] Family history of diabetes mellitus (situation) - Mother Medical History Diabetes mellitus: Type 2 Surgical History Hysterectomy: 2012 deformity AS. External ear canal AD: Normal EAC exam External ear canal AS: Normal EAC exam Tympanic membrane AD: AD tympanic membrane intact, no fluid, normal mobility on pneumotoscopy Tympanic membrane AS: AS tympanic membrane intact, no fluid, normal mobility on pneumotoscopy Hearing: AD Hearing: normal gross reception to sound and clinical speech recognition, Weber does not lateralize (midline), air conduction greater than bone conduction on Rinne testing AS Hearing: normal gross reception to sound and clinical speech recognition, Weber does not lateralize (midline), air conduction greater than bone conduction on Rinne testing External Nose: Nasal dorsum deviated to the right due to trauma. Nasal cavity: Right nasal cavity (with endoscopy): nasal mass and nasal polyp; Mucosal edema, congestion, thick mucoid drainage and polyps of the middle meatus on the right side of the; The remainder of the right nasal cavity endoscopic examination (nasal mucosa, inferior, middle, and superior turbinates, septum, superior, middle, and inferior meati, sphenoid ethmoid recess, and nasopharynx) was normal with the exception of the above findings. Left nasal cavity (with endoscopy): septal deviation, convex, airway obstruction 25 - 50%; The remainder of the left nasal cavity endoscopic examination (nasal mucosa, inferior, middle,  and superior turbinates, septum, superior, middle, and inferior meati, sphenoid ethmoid recess, and nasopharynx) was normal with the exception of the above findings. Additional findings nasal cavity: edema and erythema, polyps The remainder of the nasal cavity (right and left) endoscopic examination (nasal mucosa, inferior, middle, and superior turbinates, septum, superior, middle, and inferior meati, sphenoid ethmoid recess, and nasopharynx) was normal with the exception of the above findings. Lips, Teeth, Gums: normal lip morphology and anatomy, class I occlusion, no dental abnormalities  Oral cavity/Oropharynx: normal hard and soft palate, tongue, pharyngeal walls, buccal mucosa, floor of mouth, and tonsils  Hypopharynx (by endoscopic exam): normal pharyngeal walls and piriform sinuses Nasopharynx: normal nasopharynx Head Inspection: Normal head inspection with normal head shape, without masses or concerning lesions. Ocular Motility: orthophoric in primary gaze and normal ductions and versions OU.  Head Palpation: Normal head inspection without masses, palpable deformities, or concerning lesions.  Chest - clear to auscultation bilaterally C/V - regular rate and rhythm without murmur    Visit Note - October 03, 2022 COBY, BARTOLI MRN: [id number] DOB: 1960-12-12 Sex: Female PMS ID: [id number] Adron Bene (Primary Provider) [facility] Under) Page 3 [phone] Work [phone] Fax [facility] [place] [address] [place], [place] [address] Salivary: Right parotid mass, 1.5 x 2 cm, quite superficial - no erythema or tenderness. Facial nerve intact. Facial Strength: Right Facial Strength: I/VI: normal right face muscle tone Left Facial Strength: I/VI: normal left face muscle tone Neck: normal neck examination without skin masses, tenderness  or crepitus  Thyroid: normal thyroid examination without masses or nodules Respiratory  Effort: normal respiratory effort without labored breathing or accessory muscle use  Auscultation of Lungs: normal right lung examination without wheezing, rales or rhonchi, normal left lung examination without wheezing, rales or rhonchi. Heart Auscultation: regular rate and rhythm  Peripheral Vascular System: Normal right neck vascular exam without thrill, aneurysm or exposure, Normal left neck vascular exam without thrill, aneurysm or exposure  Neck Lymph Node: normal lymphatic exam without lymphadenopathy in cranial or cervical regions Neuro - Cranial Nerves: Cranial nerves II-XII intact. Appearance: well developed and nourished Communication: normal vocal quality and ability to communicate Orientation: Alert and oriented to person, place, time. Mood:mood and affect well-adjusted, pleasant and cooperative, appropriate for clinical and encounter circumstances Impression/Plan: Nasal obstruction Other specified disorders of nose and nasal sinuses (J34.89) Status: Inadequately Controlled Plan: Nasal endoscopy. Right Procedure: Nasal endoscopy, diagnostic Left Procedure: Nasal endoscopy, diagnostic Right Indication: Nasal obstruction Left Indication: Nasal obstruction Informed Consent: The benefits and risks of nasal endoscopy were discussed, including but not limited to: temporary pain or discomfort of the nose or throat, temporary sensation of inability to swallow, temporary bad taste, remote possibility of fainting episode. The flexible fiberoptic telescope was passed into the right naris and then passed atraumatically through the naris and through the posterior nasal cavity to the nasopharynx, and used to examine the inferior, middle, and superior turbinates, the inferior, middle, 1. Visit Note - October 03, 2022 REESHEMAH, TETTERTON MRN: [id number] DOB: 03/27/1960 Sex: Female PMS ID: [id number] Adron Bene (Primary Provider) [facility] Under) Page 4 [phone] Work [phone] Fax [facility] [place] [address] [place], [place] [address] and superior meati, and the sphenoethmoid recess. Exam findings are detailed in the exam section. The flexible fiberoptic telescope was passed into the left naris and then passed atraumatically through the naris and through the posterior nasal cavity to the nasopharynx, and used to examine the inferior, middle, and superior turbinates, the inferior, middle, and superior meati, and the sphenoethmoid recess. Exam findings are detailed in the exam section. The patient tolerated the procedure well without complications. Plan: Counseling - Nasal obstruction. Please refer to the education handout for detailed counseling. Plan: Treatment Regimen. Begin the following treatments: Recommend continuing Flonase nasal spray daily and adding nasal saline irrigations as possible, 2 sprays to each nostril bid and as needed.. Sinusitis, chronic, Right Chronic maxillary sinusitis (J32.0) Plan: Counseling - Sinusitis, chronic. Please refer to the education handout for detailed counseling. Plan: Order for Surgery. Surgery scheduling order Surgeon: Reola Mosher Procedure(s): Stereotactic computer-assisted (navigational) guidance; CPT: [phone] Right maxillary antrostomy with removal of tissue from sinus; CPT: 31267 Right total ethmoidectomy with frontal sinus exploration; CPT: 31253 Septoplasty; CPT: 30520 Estimated Time: 3.5. Post-op follow up in: 7 days Diagnosis codes: Sinusitis, chronic, Right J32.0 Additional diagnosis codes: chronic sinusitis, ICD-10: J32.9, deviated septum, ICD-10: J34.2, and Nasal polyps, ICD-10: J33.0 Target surgery date: 10/11/22. Anesthesia: general. Admission Status: outpatient. Recovery Facility: [facility]. Provider: Adron Bene Priority: normal 2. Deviated nasal septum Deviated nasal septum (J34.2) Status: Inadequately Controlled Plan:  Counseling - Deviated nasal septum. Please refer to the education handout for detailed counseling. 3. Nasal polyps Polyp of nasal cavity (J33.0) Status: Inadequately Controlled Plan: Counseling - Nasal polyps. Please refer to the education handout for detailed counseling. Plan: Treatment Regimen. 4. Visit Note - October 03, 2022 PHUONG, SELVAGGIO MRN: [id number] DOB: 11/17/1960 Sex: Female PMS ID:  [id number] Adron Bene (Primary Provider) [facility] Under) Page 5 [phone] Work [phone] Fax [facility] [place] [address] [place], [place] [address] Begin the following treatments: ** I think this may represent an allergic process such as allergic fungal sinusitis, though the Ddx should include benign vs malignant nasal masses as well. The MRI does not suggest significant vascularity. There is no HolmanMiller sign that might suggest possible JNA or other vascular skull base mass. I would recommend we proceed to the OR for exam, endoscopic biopsy if felt to be a solid tumor for pathology and, if findings felt to be c/w AFS, right sided sinus surgery (frontal, maxillary, ethmoid) with CT image guidance, and possible endoscopic septoplasty if felt necessary for nasal obstruction and/or adequate operative exposure. Anticipate [facility] OR and DC home same day. RTC - Dr. Okey Dupre at [facility] 7- 10 days postop. Discussed risks, benefits and options including bleeding, infection, CSF leak, and orbital injury / vision loss though rare - she understands and agrees to proceed. Anticipate approximately 3.5 hours for this case.. Additional Documentation: Problem Addressed: 2 or more stable chronic illnesses (Sinusitis, chronic, Right, Nasal polyps) Risk of Complications: Moderate risk of morbidity from additional diagnostic testing or treatment (Maxillary sinus mass - possible Allergic Fungal Sinusitis)  Magnus Ivan. Okey Dupre, MD, MBA,  [facility] [phone]

## 2022-10-25 ENCOUNTER — Ambulatory Visit
Admission: RE | Admit: 2022-10-25 | Discharge: 2022-10-25 | Disposition: A | Discharge status: Home / Self Care | Payer: Medicaid Other | Attending: Otolaryngology | Admitting: Otolaryngology

## 2022-10-25 ENCOUNTER — Other Ambulatory Visit: Payer: Self-pay

## 2022-10-25 ENCOUNTER — Ambulatory Visit: Payer: Medicaid Other | Admitting: Anesthesiology

## 2022-10-25 ENCOUNTER — Encounter: Payer: Self-pay | Admitting: Otolaryngology

## 2022-10-25 ENCOUNTER — Encounter
Admission: RE | Disposition: A | Discharge status: Home / Self Care | Payer: Self-pay | Source: Home / Self Care | Attending: Otolaryngology

## 2022-10-25 DIAGNOSIS — M1990 Unspecified osteoarthritis, unspecified site: Secondary | ICD-10-CM | POA: Insufficient documentation

## 2022-10-25 DIAGNOSIS — J342 Deviated nasal septum: Secondary | ICD-10-CM | POA: Insufficient documentation

## 2022-10-25 DIAGNOSIS — M545 Low back pain, unspecified: Secondary | ICD-10-CM | POA: Insufficient documentation

## 2022-10-25 DIAGNOSIS — J338 Other polyp of sinus: Secondary | ICD-10-CM | POA: Insufficient documentation

## 2022-10-25 DIAGNOSIS — J329 Chronic sinusitis, unspecified: Secondary | ICD-10-CM | POA: Insufficient documentation

## 2022-10-25 DIAGNOSIS — R0789 Other chest pain: Secondary | ICD-10-CM | POA: Diagnosis not present

## 2022-10-25 DIAGNOSIS — E119 Type 2 diabetes mellitus without complications: Secondary | ICD-10-CM | POA: Insufficient documentation

## 2022-10-25 DIAGNOSIS — J320 Chronic maxillary sinusitis: Secondary | ICD-10-CM | POA: Diagnosis present

## 2022-10-25 DIAGNOSIS — K219 Gastro-esophageal reflux disease without esophagitis: Secondary | ICD-10-CM | POA: Diagnosis not present

## 2022-10-25 HISTORY — DX: Personal history of other diseases of the respiratory system: Z87.09

## 2022-10-25 HISTORY — PX: ETHMOIDECTOMY: SHX5197

## 2022-10-25 HISTORY — PX: MAXILLARY ANTROSTOMY: SHX2003

## 2022-10-25 HISTORY — DX: Type 2 diabetes mellitus without complications: E11.9

## 2022-10-25 HISTORY — PX: IMAGE GUIDED SINUS SURGERY: SHX6570

## 2022-10-25 HISTORY — DX: Low back pain, unspecified: M54.50

## 2022-10-25 HISTORY — PX: SPHENOIDECTOMY: SHX2421

## 2022-10-25 HISTORY — PX: FRONTAL SINUS EXPLORATION: SHX6591

## 2022-10-25 LAB — GLUCOSE, CAPILLARY
Glucose-Capillary: 228 mg/dL — ABNORMAL HIGH (ref 70–99)
Glucose-Capillary: 183 mg/dL — ABNORMAL HIGH (ref 70–99)
Glucose-Capillary: 278 mg/dL — ABNORMAL HIGH (ref 70–99)

## 2022-10-25 SURGERY — SINUS SURGERY, WITH IMAGING GUIDANCE
Anesthesia: General | Site: Nose | Laterality: Right

## 2022-10-25 MED ORDER — OXYCODONE HCL 5 MG PO TABS: 10.0000 mg | ORAL_TABLET | Freq: Once | ORAL | Status: DC

## 2022-10-25 MED ORDER — PROPOFOL 10 MG/ML IV BOLUS
INTRAVENOUS | Status: DC | PRN
  Administered 2022-10-25: 150 mg via INTRAVENOUS

## 2022-10-25 MED ORDER — LIDOCAINE HCL (CARDIAC) PF 100 MG/5ML IV SOSY
PREFILLED_SYRINGE | INTRAVENOUS | Status: DC | PRN
  Administered 2022-10-25: 50 mg via INTRAVENOUS

## 2022-10-25 MED ORDER — SUCCINYLCHOLINE CHLORIDE 200 MG/10ML IV SOSY
PREFILLED_SYRINGE | INTRAVENOUS | Status: DC | PRN
  Administered 2022-10-25: 140 mg via INTRAVENOUS

## 2022-10-25 MED ORDER — FENTANYL CITRATE (PF) 100 MCG/2ML IJ SOLN
INTRAMUSCULAR | Status: DC | PRN
  Administered 2022-10-25 (×2): 50 ug via INTRAVENOUS

## 2022-10-25 MED ORDER — CEPHALEXIN 250 MG PO CAPS: 250.0000 mg | ORAL_CAPSULE | Freq: Four times a day (QID) | ORAL | 0 refills | Status: AC

## 2022-10-25 MED ORDER — DEXAMETHASONE SODIUM PHOSPHATE 4 MG/ML IJ SOLN
INTRAMUSCULAR | Status: AC
  Filled 2022-10-25: qty 3

## 2022-10-25 MED ORDER — ONDANSETRON HCL 4 MG/2ML IJ SOLN
4.0000 mg | Freq: Once | INTRAMUSCULAR | Status: AC
  Administered 2022-10-25: 4 mg via INTRAVENOUS

## 2022-10-25 MED ORDER — ONDANSETRON HCL 4 MG/2ML IJ SOLN
INTRAMUSCULAR | Status: AC
  Filled 2022-10-25: qty 2

## 2022-10-25 MED ORDER — FENTANYL CITRATE (PF) 100 MCG/2ML IJ SOLN
INTRAMUSCULAR | Status: AC
  Filled 2022-10-25: qty 2

## 2022-10-25 MED ORDER — INSULIN REGULAR HUMAN 100 UNIT/ML IJ SOLN
5.0000 [IU] | Freq: Once | INTRAMUSCULAR | Status: AC
  Administered 2022-10-25: 5 [IU] via INTRAVENOUS

## 2022-10-25 MED ORDER — MIDAZOLAM HCL 5 MG/5ML IJ SOLN
INTRAMUSCULAR | Status: DC | PRN
  Administered 2022-10-25: 2 mg via INTRAVENOUS

## 2022-10-25 MED ORDER — MIDAZOLAM HCL 2 MG/2ML IJ SOLN
INTRAMUSCULAR | Status: AC
  Filled 2022-10-25: qty 2

## 2022-10-25 MED ORDER — OXYCODONE HCL 5 MG PO TABS: 5.0000 mg | ORAL_TABLET | ORAL | 0 refills | Status: DC | PRN

## 2022-10-25 MED ORDER — INSULIN REGULAR HUMAN 100 UNIT/ML IJ SOLN
INTRAMUSCULAR | Status: AC
  Filled 2022-10-25: qty 1

## 2022-10-25 MED ORDER — ACETAMINOPHEN 10 MG/ML IV SOLN
1000.0000 mg | Freq: Once | INTRAVENOUS | Status: AC
  Administered 2022-10-25: 1000 mg via INTRAVENOUS

## 2022-10-25 MED ORDER — ACETAMINOPHEN 10 MG/ML IV SOLN
INTRAVENOUS | Status: AC
  Filled 2022-10-25: qty 100

## 2022-10-25 MED ORDER — SUGAMMADEX SODIUM 200 MG/2ML IV SOLN
INTRAVENOUS | Status: DC | PRN
  Administered 2022-10-25: 200 mg via INTRAVENOUS

## 2022-10-25 MED ORDER — LACTATED RINGERS IV SOLN: INTRAVENOUS | Status: DC

## 2022-10-25 MED ORDER — DEXMEDETOMIDINE HCL IN NACL 80 MCG/20ML IV SOLN
INTRAVENOUS | Status: DC | PRN
  Administered 2022-10-25: 4 ug via INTRAVENOUS
  Administered 2022-10-25: 8 ug via INTRAVENOUS
  Administered 2022-10-25 (×4): 4 ug via INTRAVENOUS

## 2022-10-25 MED ORDER — GLYCOPYRROLATE 0.2 MG/ML IJ SOLN
INTRAMUSCULAR | Status: DC | PRN
  Administered 2022-10-25: .2 mg via INTRAVENOUS

## 2022-10-25 MED ORDER — DEXAMETHASONE SODIUM PHOSPHATE 4 MG/ML IJ SOLN
INTRAMUSCULAR | Status: DC | PRN
  Administered 2022-10-25: 8 mg via INTRAVENOUS

## 2022-10-25 MED ORDER — EPHEDRINE SULFATE (PRESSORS) 50 MG/ML IJ SOLN
INTRAMUSCULAR | Status: DC | PRN
Start: 2022-10-25 — End: 2022-10-26
  Administered 2022-10-25 (×2): 10 mg via INTRAVENOUS

## 2022-10-25 MED ORDER — ROCURONIUM BROMIDE 100 MG/10ML IV SOLN
INTRAVENOUS | Status: DC | PRN
  Administered 2022-10-25: 30 mg via INTRAVENOUS

## 2022-10-25 MED ORDER — OXYMETAZOLINE HCL 0.05 % NA SOLN
NASAL | Status: DC | PRN
  Administered 2022-10-25: 1 via TOPICAL

## 2022-10-25 SURGICAL SUPPLY — 51 items
ANTIFOG SOL W/FOAM PAD STRL (MISCELLANEOUS) ×5
BLADE SHAVER TRUDI 4 15 DEG (ENT DISPOSABLE) ×1 IMPLANT
BLADE SHAVER TRUDI 4 60 DEG (ENT DISPOSABLE) ×5 IMPLANT
BLADE SHAVER TRUDI STR 4 (ENT DISPOSABLE) ×5 IMPLANT
CABLE TRUDI DISPOSABLE (ENT DISPOSABLE) ×10 IMPLANT
CANISTER SUCT 1200ML W/VALVE (MISCELLANEOUS) ×5 IMPLANT
CATH IV 18X1 1/4 SAFELET (CATHETERS) ×4 IMPLANT
CNTNR SPEC C3OZ STD GRAD LEK (MISCELLANEOUS) ×1 IMPLANT
COAGULATOR SUCT 8FR VV (MISCELLANEOUS) ×5 IMPLANT
CONT SPEC 3OZ W/LID STRL (MISCELLANEOUS) ×5
CORD BIP STRL DISP 12FT (MISCELLANEOUS) ×4 IMPLANT
DEVICE INFLATION SEID (MISCELLANEOUS) IMPLANT
DRSG TELFA 3X4 N-ADH STERILE (GAUZE/BANDAGES/DRESSINGS) ×1 IMPLANT
ELECT REM PT RETURN 9FT ADLT (ELECTROSURGICAL) ×5
ELECTRODE REM PT RTRN 9FT ADLT (ELECTROSURGICAL) ×4 IMPLANT
GAUZE SPONGE 4X4 12PLY STRL (GAUZE/BANDAGES/DRESSINGS) ×2 IMPLANT
GLOVE SURG GAMMEX PI TX LF 7.5 (GLOVE) ×11 IMPLANT
GOWN STRL REUS W/ TWL LRG LVL3 (GOWN DISPOSABLE) ×7 IMPLANT
GOWN STRL REUS W/TWL LRG LVL3 (GOWN DISPOSABLE) ×15
HANDLE SUCTION POOLE (INSTRUMENTS) IMPLANT
IV CATH 18X1 1/4 SAFELET (CATHETERS)
IV NS 500ML (IV SOLUTION) ×5
IV NS 500ML BAXH (IV SOLUTION) ×5 IMPLANT
KIT TURNOVER KIT A (KITS) ×5 IMPLANT
KNIFE 45D UP 2.3 (MISCELLANEOUS) ×4 IMPLANT
NDL ANESTHESIA 27G X 3.5 (NEEDLE) ×4 IMPLANT
NEEDLE ANESTHESIA 27G X 3.5 (NEEDLE)
NS IRRIG 500ML POUR BTL (IV SOLUTION) ×6 IMPLANT
PACK ENT CUSTOM (PACKS) ×5 IMPLANT
PACKING NASAL EPIS 4X2.4 XEROG (MISCELLANEOUS) ×10 IMPLANT
PACKING NASAL HEMOPORE 8 (HEMOSTASIS) ×1 IMPLANT
PATTIES SURGICAL .5 X3 (DISPOSABLE) ×5 IMPLANT
SOLUTION ANTFG W/FOAM PAD STRL (MISCELLANEOUS) ×4 IMPLANT
SPLINT NASAL AIRWAY SILICONE (MISCELLANEOUS) ×5 IMPLANT
STAPLER SKIN PROX 35W (STAPLE) ×5 IMPLANT
STRAP BODY AND KNEE 60X3 (MISCELLANEOUS) ×5 IMPLANT
STRIP CLOSURE SKIN 1/2X4 (GAUZE/BANDAGES/DRESSINGS) ×5 IMPLANT
SUCTION POOLE HANDLE (INSTRUMENTS) ×5
SUCTION TUBE FRAZIER 10FR DISP (SUCTIONS) ×6 IMPLANT
SUT ETHILON 3-0 KS 30 BLK (SUTURE) ×4 IMPLANT
SUT PLAIN GUT 4-0 (SUTURE) ×4 IMPLANT
SYR 50ML LL SCALE MARK (SYRINGE) ×1 IMPLANT
SYR EAR/ULCER 2OZ (SYRINGE) ×1 IMPLANT
SYSTEM BALLOON SINUPLASTY 6X16 (SINUPLASTY) IMPLANT
TOWEL OR 17X26 4PK STRL BLUE (TOWEL DISPOSABLE) ×6 IMPLANT
TOWEL ~~LOC~~+RFID 17X26 BLUE (SPONGE) ×1 IMPLANT
TRACKER DISPOSABLE PAITIENT (MISCELLANEOUS) ×5 IMPLANT
TUBING CONNECTING 10 (TUBING) ×1 IMPLANT
TUBING IRRIGATION BIEN-AIR (TUBING) ×5 IMPLANT
WATER STERILE IRR 250ML POUR (IV SOLUTION) ×5 IMPLANT
WATER STERILE IRR 500ML POUR (IV SOLUTION) ×1 IMPLANT

## 2022-10-25 NOTE — Op Note (Addendum)
OPERATIVE REPORT   Attending Physician: Magnus Ivan. 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   Procedure(s) Performed:  1.  Right endoscopic total ethmoidectomy and sphenoid sinusotomy with tissue removal, CPT 31259-50 2.  Right endoscopic maxillary antrostomy with tissue removal, CPT 65784-69 3.  Right endoscopic frontal sinusotomy, CPT 62952-84 4.  Bilateral CT image guidance with Medtronic Stealth/Fusion System, CPT [id number] 5.  Bilateral sinonasal endoscopy, CPT [phone]   Teaching Surgeon:  Magnus Ivan. Okey Dupre, MD, MBA, FARS Assistants:  Anesthesia:  General with regular cuffed ET tube Specimens:  Right sinus contents. Cultures:  Right maxillary sinus tissue for GS and Cx and Fungal Cultures Estimated Blood Loss:  50 mL.  Operative Findings:  Bilateral sinonasal endoscopy was performed to evaluate involvement of the disease process, which was found to be primarily on the right side with no obvious left sided involvement.  Intra-operative findings c/w chronic sinusitis and polypoid mucosa on the right.   Left side appeared normal on intra-operative sinonasal endoscopy Right sided nasal polyps; maxillary sinus polypoid mucosa and allergic mucin c/w AFS; ethmoid polypoid mucosa; retained secretions of right maxillary, ethmoid, sphenoid, and frontal sinuses.  Procedure: After informed consent was obtained from the patient and the patient was brought from the preoperative holding area to the operating room and placed supine on the operating room table. After induction of general anesthesia, a timeout was called and all parties were in agreement.    At this point, attention was turned to the sinus surgery portion of the procedure.  The patient was then registered with the [employer]clarent Trudi CT image-guidance system  accurately (both straight and curved pointers / instruments).  The patient was then draped in the usual sterile manner.  The nose was examined endoscopically on each side.    Nasal pledgets were placed bilaterally with topical oxymetazoline for decongesting of the nasal mucosa.  A 0-degree hopkins rod telescope was then used for bilateral sinonasal endoscopy with the above noted findings.  The nasal cavity was examined bilaterally using a 0 degree sinus scope revealing significant nasal polyps on the right side, slight septal deviation to the right, and normal findings on the left side.  With involvement ruled out on the left side and attention turned to the right.    The right side was then examined using a zero degree sinus scope. The right middle turbinate was medialized gently and the uncinate ressected with a micro [place] pediatric back biter.  Significant nasal polyps were removed from the nasal cavity using a straight microdebrider, leading to the maxillary sinus.  A maxillary antrostomy was then created and polypoid tissue from the middle meatus ressected.  A significant amount of polyps and thick allergic mucin / mucoid drainage was removed from the maxillary sinus using a curved suction, curved microdebrider, irrigation, and a J-curette.  Next a total ethmoidectomy was performed using a suction, J-curette and a straight blakesly forcep.  The natural ostia of the sphenoid sinus was then identified and cannulated without difficulty and a J-curette and mushroom punch used to adequately widen the opening.  Remaining ethmoid cells were then removed superiorly from the anterior skull base in a posterior to anterior manner.  Lastly a 45 degree scope and curved suction were used for exploration of the frontal recess.  Care was taken to identify  the course or, and avoid, the anterior ethmoid artery.  The frontal recess was explored and anatomy confirmed using a registered, curved tracking instrument with the  image guidance system.  A frontal sinusotomy was made using a ball tip probe and widened using a frontal sinus curette and upbiting frontal sinus forceps.  Mucoid drainage was removed with a curved suction.   The sinuses were then irrigated throughout, on the right side, using warm saline.  Regular dissolvable Hemapore was then placed gently in the middle meatus and ethmoid cavity on the right side.  Adequate hemostasis was assured.  The patient's eyes were gently irrigated with BSS solution.The stomach was then suctioned, a drip pad with 4x4 gauze applied and the patient rotated 90 degrees back toward anesthesia.  The patient's care was then turned over to the Anesthesia team who successfully awakened the patient without event. The patient was extubated successfully and  transported to the PACU in stable condition.  A debrief was performed - all instrument, sharp and lap counts were correct at the end of the case.   Teaching Surgeon Attestation:  I was present and participated during all critical and key portions of the procedure(s) and immediately available throughout.   Magnus Ivan. Okey Dupre, MD, MBA, FARS Rhinology, Allergy & Sinus Surgery  Otolaryngology-Head & Neck Surgery  ENT [phone]

## 2022-10-25 NOTE — Anesthesia Procedure Notes (Signed)
Procedure Name: Intubation Date/Time: 10/25/2022 11:12 AM  Performed by: Andee Poles, CRNAPre-anesthesia Checklist: Patient identified, Emergency Drugs available, Suction available, Patient being monitored and Timeout performed Patient Re-evaluated:Patient Re-evaluated prior to induction Oxygen Delivery Method: Circle system utilized Preoxygenation: Pre-oxygenation with 100% oxygen Induction Type: IV induction Ventilation: Mask ventilation without difficulty Laryngoscope Size: Mac and 3 Grade View: Grade II Tube type: Oral Rae Tube size: 7.0 mm Number of attempts: 1 Airway Equipment and Method: LTA kit utilized Placement Confirmation: ETT inserted through vocal cords under direct vision, positive ETCO2 and breath sounds checked- equal and bilateral Tube secured with: Tape Dental Injury: Teeth and Oropharynx as per pre-operative assessment

## 2022-10-25 NOTE — Interval H&P Note (Signed)
History and Physical Interval Note:  10/25/2022 10:32 AM  Hannah [name]  has presented today for surgery, with the diagnosis of Chronic sinusitis, Nasal obstruction.  The various methods of treatment have been discussed with the patient and family. After consideration of risks, benefits and other options for treatment, the patient has consented to  Procedure(s): IMAGE GUIDED SINUS SURGERY (N/A) TOTAL ETHMOIDECTOMY (Right) FRONTAL SINUS EXPLORATION (Right) MAXILLARY ANTROSTOMY WITH TISSUE REMOVAL (Right) SEPTOPLASTY (Bilateral) as a surgical intervention.  The patient's history has been reviewed, patient examined, no change in status, stable for surgery.  I have reviewed the patient's chart and labs.  Questions were answered to the patient's satisfaction.     Reola Mosher S  No changes to H&P  [employer]. Okey Dupre, MD, MBA, [facility] Otolaryngology-Head & Neck Surgery [facility] [phone]

## 2022-10-25 NOTE — Transfer of Care (Signed)
Immediate Anesthesia Transfer of Care Note  Patient: Hannah [name]  Procedure(s) Performed: IMAGE GUIDED SINUS SURGERY (Nose) TOTAL ETHMOIDECTOMY (Right: Nose) FRONTAL SINUS EXPLORATION (Right: Nose) MAXILLARY ANTROSTOMY WITH TISSUE REMOVAL (Right) SPHENOIDECTOMY (Nose)  Patient Location: PACU  Anesthesia Type: General ETT  Level of Consciousness: awake, alert  and patient cooperative  Airway and Oxygen Therapy: Patient Spontanous Breathing and Patient connected to supplemental oxygen  Post-op Assessment: Post-op Vital signs reviewed, Patient's Cardiovascular Status Stable, Respiratory Function Stable, Patent Airway and No signs of Nausea or vomiting  Post-op Vital Signs: Reviewed and stable  Complications: No notable events documented.

## 2022-10-26 ENCOUNTER — Encounter: Payer: Self-pay | Admitting: Otolaryngology

## 2022-10-26 NOTE — Anesthesia Postprocedure Evaluation (Signed)
Anesthesia Post Note  Patient: Hannah [name]  Procedure(s) Performed: IMAGE GUIDED SINUS SURGERY (Nose) TOTAL ETHMOIDECTOMY (Right: Nose) FRONTAL SINUS EXPLORATION (Right: Nose) MAXILLARY ANTROSTOMY WITH TISSUE REMOVAL (Right) SPHENOIDECTOMY (Nose)  Patient location during evaluation: PACU Anesthesia Type: General Level of consciousness: awake and alert Pain management: pain level controlled Vital Signs Assessment: post-procedure vital signs reviewed and stable Respiratory status: spontaneous breathing, nonlabored ventilation, respiratory function stable and patient connected to nasal cannula oxygen Cardiovascular status: blood pressure returned to baseline and stable Postop Assessment: no apparent nausea or vomiting Anesthetic complications: no   No notable events documented.   Last Vitals:  Vitals:   10/25/22 1430 10/25/22 1445  BP: (!) 150/86 (!) 146/84  Pulse: 84 73  Resp: (!) 21 20  Temp:    SpO2: 98% 99%    Last Pain:  Vitals:   10/25/22 1445  TempSrc:   PainSc: 0-No pain                 Leopoldo Mazzie C Kalista Laguardia

## 2022-10-30 LAB — AEROBIC/ANAEROBIC CULTURE W GRAM STAIN (SURGICAL/DEEP WOUND)
Culture: NO GROWTH
Gram Stain: NONE SEEN

## 2022-11-02 LAB — SURGICAL PATHOLOGY

## 2022-11-23 LAB — FUNGUS CULTURE WITH STAIN

## 2022-11-23 LAB — FUNGUS CULTURE RESULT

## 2022-11-23 LAB — FUNGAL ORGANISM REFLEX

## 2023-03-15 ENCOUNTER — Other Ambulatory Visit: Payer: Self-pay | Admitting: Physician Assistant

## 2023-03-15 DIAGNOSIS — Z1231 Encounter for screening mammogram for malignant neoplasm of breast: Secondary | ICD-10-CM

## 2023-04-24 ENCOUNTER — Ambulatory Visit
Admission: RE | Admit: 2023-04-24 | Discharge: 2023-04-24 | Disposition: A | Discharge status: Home / Self Care | Payer: Medicaid Other | Source: Ambulatory Visit | Attending: Physician Assistant | Admitting: Physician Assistant

## 2023-04-24 DIAGNOSIS — Z1231 Encounter for screening mammogram for malignant neoplasm of breast: Secondary | ICD-10-CM | POA: Insufficient documentation

## 2023-11-02 ENCOUNTER — Ambulatory Visit
Admission: EM | Admit: 2023-11-02 | Discharge: 2023-11-02 | Disposition: A | Discharge status: Home / Self Care | Attending: Emergency Medicine | Admitting: Emergency Medicine

## 2023-11-02 ENCOUNTER — Encounter: Payer: Self-pay | Admitting: Emergency Medicine

## 2023-11-02 DIAGNOSIS — B3731 Acute candidiasis of vulva and vagina: Secondary | ICD-10-CM | POA: Diagnosis present

## 2023-11-02 DIAGNOSIS — R300 Dysuria: Secondary | ICD-10-CM | POA: Diagnosis present

## 2023-11-02 LAB — URINALYSIS, W/ REFLEX TO CULTURE (INFECTION SUSPECTED)
Bilirubin Urine: NEGATIVE
Glucose, UA: >=500 mg/dL — AB
Hgb urine dipstick: NEGATIVE
Ketones, ur: NEGATIVE mg/dL
Leukocytes,Ua: NEGATIVE
Nitrite: NEGATIVE
Protein, ur: NEGATIVE mg/dL
RBC / HPF: NONE SEEN RBC/hpf (ref 0–5)
Specific Gravity, Urine: 1.015 (ref 1.005–1.030)
WBC, UA: NONE SEEN WBC/hpf (ref 0–5)
pH: 7 (ref 5.0–8.0)

## 2023-11-02 MED ORDER — FLUCONAZOLE 150 MG PO TABS
150.0000 mg | ORAL_TABLET | ORAL | 0 refills | Status: AC
Start: 2023-11-02 — End: 2023-11-09

## 2023-11-02 NOTE — Discharge Instructions (Addendum)
 Your urinalysis did not show any evidence of UTI but when I looked at the sample under microscope a a lot of yeast was present.  I suspect that you have a vaginal yeast infection which is causing your urinary symptoms.  The cytology swab is still pending and that we will also check to see if you have bacterial vaginosis which could be contributing to your problems.  For these infection begin taking Diflucan 150 mg tablets, take 1 tablet today and repeat dosing every 3 days for total of 3 doses.  If you develop any new or worsening symptoms either return for reevaluation or follow-up with your primary care provider.

## 2023-11-02 NOTE — ED Triage Notes (Signed)
 Patient reports vaginal burning and itching that started 3 days ago.  Patient also wants to be checked for UTI.

## 2023-11-02 NOTE — ED Provider Notes (Signed)
 [facility]    CSN: [id number] Arrival date & time: 11/02/23  1444      History   Chief Complaint Chief Complaint  Patient presents with   Dysuria   Vaginal Itching    HPI Hannah [name] is a 63 y.o. female.   HPI  63 year old female with past medical history significant for diabetes, GERD, bilateral low back pain without sciatica, and arthritis presents for evaluation of vaginal burning and itching that is been going on for the last 3 days.  She endorses urinary urgency and frequency but reports that that is at baseline.  She does have burning at the end of urination but not during.  She has not been experiencing any blood in the urine, fever, or vaginal discharge.  She does use vaginal Premarin 2-3 times a week.  Past Medical History:  Diagnosis Date   Arthritis    wrist and ankles   Bilateral low back pain without sciatica    Chest pain, atypical    neg workup- has physical job, lots of lifting, sore muscles   GERD (gastroesophageal reflux disease)    uses Tums as needed   Hx of bronchitis    Motion sickness    boats   Newly diagnosed diabetes [facility])     Patient Active Problem List   Diagnosis Date Noted   Leiomyoma of uterus, unspecified 11/23/2010    Past Surgical History:  Procedure Laterality Date   ABDOMINAL HYSTERECTOMY  11/27/2010   Procedure: HYSTERECTOMY ABDOMINAL;  Surgeon: Olam DELENA Mill, MD;  Location: [facility];  Service: Gynecology;  Laterality: N/A;   CARDIAC CATHETERIZATION  03/23/04   DORSAL COMPARTMENT RELEASE Right 08/06/2014   Procedure: RELEASE DORSAL COMPARTMENT (DEQUERVAIN);  Surgeon: Helayne Glenn, MD;  Location: [facility];  Service: Orthopedics;  Laterality: Right;   ETHMOIDECTOMY Right 10/25/2022   Procedure: TOTAL ETHMOIDECTOMY;  Surgeon: Rumalda Massie RAMAN, MD;  Location: [facility];  Service: ENT;  Laterality: Right;   FRONTAL SINUS EXPLORATION Right 10/25/2022   Procedure: FRONTAL SINUS EXPLORATION;   Surgeon: Rumalda Massie RAMAN, MD;  Location: [facility];  Service: ENT;  Laterality: Right;   IMAGE GUIDED SINUS SURGERY N/A 10/25/2022   Procedure: IMAGE GUIDED SINUS SURGERY;  Surgeon: Rumalda Massie RAMAN, MD;  Location: [facility];  Service: ENT;  Laterality: N/A;   LAPAROSCOPY  11/27/2010   Procedure: LAPAROSCOPY DIAGNOSTIC;  Surgeon: Olam DELENA Mill, MD;  Location: [facility];  Service: Gynecology;  Laterality: N/A;   MAXILLARY ANTROSTOMY Right 10/25/2022   Procedure: MAXILLARY ANTROSTOMY WITH TISSUE REMOVAL;  Surgeon: Rumalda Massie RAMAN, MD;  Location: [facility];  Service: ENT;  Laterality: Right;   SPHENOIDECTOMY  10/25/2022   Procedure: CLEONE;  Surgeon: Rumalda Massie RAMAN, MD;  Location: [facility];  Service: ENT;;   TUBAL LIGATION      OB History   No obstetric history on file.      Home Medications    Prior to Admission medications   Medication Sig Start Date End Date Taking? Authorizing Provider  atorvastatin (LIPITOR) 40 MG tablet ATORVASTATIN CALCIUM 40 MG TABS 03/18/23  Yes [provider]  cetirizine (ZYRTEC) 10 MG tablet CETIRIZINE HCL 10 MG TABS 04/23/22  Yes [provider]  fluconazole (DIFLUCAN) 150 MG tablet Take 1 tablet (150 mg total) by mouth every 3 (three) days for 3 doses. 11/02/23 11/09/23 Yes Bernardino Ditch, NP  PREMARIN vaginal cream PREMARIN 0.625 MG/GM CREA 07/03/23  Yes [provider]  cholecalciferol (VITAMIN D3) 25 MCG (1000 UNIT) tablet Take 1,000 Units by mouth daily.    [provider]  ibuprofen  (ADVIL ,MOTRIN ) 200 MG tablet Take 200 mg by mouth every 6 (six) hours as needed.    [provider]  insulin  glargine (LANTUS) 100 UNIT/ML injection Inject 16 Units into the skin daily after supper.    [provider]  JARDIANCE 10 MG TABS tablet Take 10 mg by mouth daily.    [provider]    Family History Family History  Problem Relation Age of Onset   Breast cancer  Maternal Aunt    Breast cancer Other 9    Social History Social History   Tobacco Use   Smoking status: Never   Smokeless tobacco: Never  Vaping Use   Vaping status: Never Used  Substance Use Topics   Alcohol use: Not Currently    Comment: 5 drinks per year   Drug use: No     Allergies   Patient has no known allergies.   Review of Systems Review of Systems  Constitutional:  Negative for fever.  Genitourinary:  Positive for dysuria, frequency, urgency and vaginal pain. Negative for hematuria and vaginal discharge.     Physical Exam Triage Vital Signs ED Triage Vitals  Encounter Vitals Group     BP      Girls Systolic BP Percentile      Girls Diastolic BP Percentile      Boys Systolic BP Percentile      Boys Diastolic BP Percentile      Pulse      Resp      Temp      Temp src      SpO2      Weight      Height      Head Circumference      Peak Flow      Pain Score      Pain Loc      Pain Education      Exclude from Growth Chart    No data found.  Updated Vital Signs BP 137/80 (BP Location: Left Arm)   Pulse 80   Temp 98.1 F (36.7 C) (Oral)   Resp 14   Ht 5' 7 (1.702 m)   Wt 186 lb 15.2 oz (84.8 kg)   LMP 08/27/2010   SpO2 100%   BMI 29.28 kg/m   Visual Acuity Right Eye Distance:   Left Eye Distance:   Bilateral Distance:    Right Eye Near:   Left Eye Near:    Bilateral Near:     Physical Exam Vitals and nursing note reviewed.  Constitutional:      Appearance: Normal appearance. She is not ill-appearing.  HENT:     Head: Normocephalic and atraumatic.  Cardiovascular:     Rate and Rhythm: Normal rate and regular rhythm.     Pulses: Normal pulses.     Heart sounds: Normal heart sounds. No murmur heard.    No friction rub. No gallop.  Pulmonary:     Effort: Pulmonary effort is normal.     Breath sounds: Normal breath sounds. No wheezing, rhonchi or rales.  Abdominal:     Tenderness: There is no right CVA tenderness or left CVA  tenderness.  Skin:    General: Skin is warm and dry.     Capillary Refill: Capillary refill takes less than 2 seconds.     Findings: No rash.  Neurological:     General:  No focal deficit present.     Mental Status: She is alert and oriented to person, place, and time.      UC Treatments / Results  Labs (all labs ordered are listed, but only abnormal results are displayed) Labs Reviewed  URINALYSIS, W/ REFLEX TO CULTURE (INFECTION SUSPECTED) - Abnormal; Notable for the following components:      Result Value   Glucose, UA >=500 (*)    Bacteria, UA MANY (*)    All other components within normal limits  CERVICOVAGINAL ANCILLARY ONLY    EKG   Radiology No results found.  Procedures Procedures (including critical care time)  Medications Ordered in UC Medications - No data to display  Initial Impression / Assessment and Plan / UC Course  I have reviewed the triage vital signs and the nursing notes.  Pertinent labs & imaging results that were available during my care of the patient were reviewed by me and considered in my medical decision making (see chart for details).   Patient is very pleasant, nontoxic-appearing 63 year old female presenting for evaluation of vaginal burning and itching with some associated urinary symptoms.  However, since some of her urinary symptoms she has at baseline due to having diabetes.  She has not noticed any vaginal discharge, or hematuria.  She would like to be checked for UTI.  I will order a vaginal cytology swab to assess for BV or yeast as well as a urinalysis to assess for the presence of UTI.  She reports that she is using topical Premarin 2-3 times a week which makes genitourinary syndrome of menopause less likely, but not impractical.  Urinalysis shows greater than 500 glucose but is otherwise unremarkable.  Reflex microscopy shows many bacteria and budding yeast present.  I will discharge patient home with a diagnosis of vaginal yeast  infection and start her on Diflucan 150 mg tablets, 1 tablet now and repeat dosing every 3 days for total of 3 doses.   Final Clinical Impressions(s) / UC Diagnoses   Final diagnoses:  Dysuria  Vaginal yeast infection     Discharge Instructions      Your urinalysis did not show any evidence of UTI but when I looked at the sample under microscope a a lot of yeast was present.  I suspect that you have a vaginal yeast infection which is causing your urinary symptoms.  The cytology swab is still pending and that we will also check to see if you have bacterial vaginosis which could be contributing to your problems.  For these infection begin taking Diflucan 150 mg tablets, take 1 tablet today and repeat dosing every 3 days for total of 3 doses.  If you develop any new or worsening symptoms either return for reevaluation or follow-up with your primary care provider.     ED Prescriptions     Medication Sig Dispense Auth. Provider   fluconazole (DIFLUCAN) 150 MG tablet Take 1 tablet (150 mg total) by mouth every 3 (three) days for 3 doses. 3 tablet Bernardino Ditch, NP      PDMP not reviewed this encounter.   Bernardino Ditch, NP 11/02/23 1536

## 2023-11-04 ENCOUNTER — Ambulatory Visit (HOSPITAL_COMMUNITY): Payer: Self-pay

## 2023-11-04 LAB — CERVICOVAGINAL ANCILLARY ONLY
Bacterial Vaginitis (gardnerella): NEGATIVE
Candida Glabrata: NEGATIVE
Candida Vaginitis: POSITIVE — AB
Comment: NEGATIVE
Comment: NEGATIVE
Comment: NEGATIVE

## 2023-11-19 ENCOUNTER — Other Ambulatory Visit: Payer: Self-pay | Admitting: Physician Assistant

## 2023-11-19 ENCOUNTER — Ambulatory Visit
Admission: RE | Admit: 2023-11-19 | Discharge: 2023-11-19 | Disposition: A | Discharge status: Home / Self Care | Attending: Physician Assistant | Admitting: Physician Assistant

## 2023-11-19 ENCOUNTER — Ambulatory Visit
Admission: RE | Admit: 2023-11-19 | Discharge: 2023-11-19 | Disposition: A | Discharge status: Home / Self Care | Source: Ambulatory Visit | Attending: Physician Assistant | Admitting: Physician Assistant

## 2023-11-19 DIAGNOSIS — R52 Pain, unspecified: Secondary | ICD-10-CM

## 2024-03-11 ENCOUNTER — Other Ambulatory Visit: Payer: Self-pay | Admitting: Physician Assistant

## 2024-03-11 DIAGNOSIS — Z1231 Encounter for screening mammogram for malignant neoplasm of breast: Secondary | ICD-10-CM

## 2024-04-29 ENCOUNTER — Encounter
